# Patient Record
Sex: Male | Born: 1965
Health system: Southern US, Community
[De-identification: ages and names within clinical notes are randomized; demographics above are authoritative.]

## PROBLEM LIST (undated history)

## (undated) DIAGNOSIS — E739 Lactose intolerance, unspecified: Secondary | ICD-10-CM

## (undated) DIAGNOSIS — K589 Irritable bowel syndrome without diarrhea: Secondary | ICD-10-CM

## (undated) DIAGNOSIS — F32A Depression, unspecified: Secondary | ICD-10-CM

## (undated) DIAGNOSIS — M1990 Unspecified osteoarthritis, unspecified site: Secondary | ICD-10-CM

## (undated) DIAGNOSIS — F419 Anxiety disorder, unspecified: Secondary | ICD-10-CM

## (undated) DIAGNOSIS — K5090 Crohn's disease, unspecified, without complications: Secondary | ICD-10-CM

## (undated) DIAGNOSIS — K635 Polyp of colon: Secondary | ICD-10-CM

## (undated) DIAGNOSIS — F329 Major depressive disorder, single episode, unspecified: Secondary | ICD-10-CM

## (undated) HISTORY — DX: Depression, unspecified: F32.A

## (undated) HISTORY — DX: Unspecified osteoarthritis, unspecified site: M19.90

## (undated) HISTORY — DX: Crohn's disease, unspecified, without complications: K50.90

## (undated) HISTORY — PX: APPENDECTOMY: SHX54

## (undated) HISTORY — DX: Anxiety disorder, unspecified: F41.9

## (undated) HISTORY — DX: Major depressive disorder, single episode, unspecified: F32.9

## (undated) HISTORY — DX: Irritable bowel syndrome, unspecified: K58.9

## (undated) HISTORY — DX: Lactose intolerance, unspecified: E73.9

## (undated) HISTORY — DX: Polyp of colon: K63.5

---

## 1998-01-14 ENCOUNTER — Ambulatory Visit (HOSPITAL_COMMUNITY): Admission: RE | Admit: 1998-01-14 | Discharge: 1998-01-14 | Payer: Self-pay | Admitting: Family Medicine

## 1998-07-24 ENCOUNTER — Other Ambulatory Visit: Admission: RE | Admit: 1998-07-24 | Discharge: 1998-07-24 | Payer: Self-pay | Admitting: Gastroenterology

## 1998-08-07 ENCOUNTER — Encounter: Payer: Self-pay | Admitting: Gastroenterology

## 1998-08-07 ENCOUNTER — Ambulatory Visit (HOSPITAL_COMMUNITY): Admission: RE | Admit: 1998-08-07 | Discharge: 1998-08-07 | Payer: Self-pay | Admitting: Gastroenterology

## 2002-01-13 ENCOUNTER — Encounter: Payer: Self-pay | Admitting: Gastroenterology

## 2002-01-13 ENCOUNTER — Encounter: Admission: RE | Admit: 2002-01-13 | Discharge: 2002-01-13 | Payer: Self-pay | Admitting: Gastroenterology

## 2002-09-01 ENCOUNTER — Emergency Department (HOSPITAL_COMMUNITY): Admission: EM | Admit: 2002-09-01 | Discharge: 2002-09-01 | Payer: Self-pay | Admitting: Emergency Medicine

## 2002-09-01 ENCOUNTER — Encounter: Payer: Self-pay | Admitting: Emergency Medicine

## 2004-07-22 ENCOUNTER — Encounter: Admission: RE | Admit: 2004-07-22 | Discharge: 2004-07-22 | Payer: Self-pay | Admitting: Gastroenterology

## 2004-08-19 ENCOUNTER — Ambulatory Visit (HOSPITAL_COMMUNITY): Admission: RE | Admit: 2004-08-19 | Discharge: 2004-08-19 | Payer: Self-pay | Admitting: Gastroenterology

## 2011-11-25 ENCOUNTER — Ambulatory Visit (INDEPENDENT_AMBULATORY_CARE_PROVIDER_SITE_OTHER): Payer: PRIVATE HEALTH INSURANCE | Admitting: Internal Medicine

## 2011-11-25 ENCOUNTER — Encounter: Payer: Self-pay | Admitting: Internal Medicine

## 2011-11-25 VITALS — BP 100/62 | HR 73 | Temp 98.0°F | Resp 16 | Ht 69.5 in | Wt 140.0 lb

## 2011-11-25 DIAGNOSIS — K589 Irritable bowel syndrome without diarrhea: Secondary | ICD-10-CM | POA: Insufficient documentation

## 2011-11-25 DIAGNOSIS — F341 Dysthymic disorder: Secondary | ICD-10-CM

## 2011-11-25 DIAGNOSIS — G4700 Insomnia, unspecified: Secondary | ICD-10-CM | POA: Insufficient documentation

## 2011-11-25 DIAGNOSIS — F418 Other specified anxiety disorders: Secondary | ICD-10-CM

## 2011-11-25 MED ORDER — CILIDINIUM-CHLORDIAZEPOXIDE 2.5-5 MG PO CAPS: 1.0000 | ORAL_CAPSULE | Freq: Three times a day (TID) | ORAL | Status: DC | PRN

## 2011-11-25 MED ORDER — PAROXETINE HCL 20 MG PO TABS: 20.0000 mg | ORAL_TABLET | ORAL | Status: DC

## 2011-11-25 MED ORDER — TEMAZEPAM 30 MG PO CAPS: 30.0000 mg | ORAL_CAPSULE | Freq: Every evening | ORAL | Status: DC | PRN

## 2011-11-25 NOTE — Progress Notes (Signed)
  Subjective:    Patient ID: Ross Schwartz, male    DOB: July 09, 1965, 46 y.o.   MRN: [id number]  HPI Patient Active Problem List  Diagnosis  . IBS (irritable bowel syndrome)  . Depression with anxiety  . Insomnia   Last office visit 07/24/2010 He has done well this past year with Prozac, Xanax, and Librax. He takes one Xanax every few weeks to sleep when anxious He takes Prozac every other day He needs Librax for IBS every few weeks He is now concerned about the potential for losing his long-term job/this is creating anxiety He feels an obligation to his wife and children/70 year old daughter providing some stress He has some concerns that either his annex or Prozac or affecting his memory as he forgets things at work frequently/ remembers them later/he doesn't lose things/Has some out of body sensation occasionally Continues to smoke/no drug or alcohol problems Review of Systems  Constitutional: Negative for activity change, appetite change and unexpected weight change.  Eyes: Negative for visual disturbance.  Respiratory: Negative.   Cardiovascular: Negative.   Gastrointestinal: Negative.   Genitourinary: Negative.        Objective:   Physical Exam Blood pressure 100/62 Weight stable HEENT clear Neuro intact Mood good/affect stable/judgment sound       Assessment & Plan:   1. IBS (irritable bowel syndrome)   2. Depression with anxiety   3. Insomnia   Plan-change Prozac to Paxil which he's been on in the past DC Xanax/continue Librax/trial Restoril at bedtime if needed Followup in one to 3 months if not responding otherwise followup in 1 year

## 2014-11-18 ENCOUNTER — Ambulatory Visit (INDEPENDENT_AMBULATORY_CARE_PROVIDER_SITE_OTHER): Payer: Self-pay | Admitting: Internal Medicine

## 2014-11-18 VITALS — BP 118/70 | HR 68 | Temp 98.2°F | Resp 18 | Ht 68.75 in | Wt 131.8 lb

## 2014-11-18 DIAGNOSIS — K122 Cellulitis and abscess of mouth: Secondary | ICD-10-CM

## 2014-11-18 MED ORDER — CLINDAMYCIN HCL 150 MG PO CAPS: 150.0000 mg | ORAL_CAPSULE | Freq: Three times a day (TID) | ORAL | Status: DC

## 2014-11-18 NOTE — Progress Notes (Signed)
   Subjective:    Patient ID: Ross Schwartz, male    DOB: Sep 21, 1965, 49 y.o.   MRN: [id number] This chart was scribed for Ellamae Sia, MD by Littie Deeds, Medical Scribe. This patient was seen in Room 8 and the patient's care was started at 10:03 AM.   HPI HPI Comments: Ross Schwartz is a 49 y.o. male with a history of Crohn's disease who presents to the [facility] complaining of sudden onset sore throat that started 3 days ago after he swallowed some beer. Patient was drinking a beer outdoors when he felt as if he swallowed a bug after taking a sip of beer. He is unsure if there was a bug as he was unable to spit anything out. Since then, his throat has felt itchy and scratchy. This morning, he felt some swelling to the back of his tongue with some difficulty swallowing. Patient denies fever and cough. He has allergies to amoxicillin, penicillin and azithromycin.  He has noticed a dramatic change in his weight loss. Patient recently separated from his wife and has been going through increased stress. He has also been having diarrhea and does not have a good appetite. He denies palpitations and urinary symptoms. He also denies problems with acid reflux. See past history of IBS and concerns for potential Crohn's disease. See past history of anxiety disorder. Patient would like to have his appetite back; he states he despises eating food and would like that to change. His last colonoscopy was about 5 years ago. PSHx includes appendectomy.  Review of Systems Noncontributory    Objective:   Physical Exam  Constitutional: He is oriented to person, place, and time. He appears well-developed and well-nourished. No distress.  HENT:  Head: Normocephalic and atraumatic.  Mouth/Throat: Oropharynx is clear and moist. No oropharyngeal exudate.  The back of the throat is red and the uvula is swollen and red. The tongue appears normal. There are no regional lymph nodes.  Eyes: EOM  are normal. Pupils are equal, round, and reactive to light.  Neck: Neck supple. No thyromegaly present.  Cardiovascular: Normal rate.   Pulmonary/Chest: Effort normal and breath sounds normal.  Abdominal: Soft. He exhibits no mass. There is no tenderness.  Musculoskeletal: He exhibits no edema.  Lymphadenopathy:    He has no cervical adenopathy.  Neurological: He is alert and oriented to person, place, and time. No cranial nerve deficit.  Skin: Skin is warm and dry. No rash noted.  Psychiatric: He has a normal mood and affect. His behavior is normal.  Vitals reviewed. BP 118/70 mmHg  Pulse 68  Temp(Src) 98.2 F (36.8 C) (Oral)  Resp 18  Ht 5' 8.75" (1.746 m)  Wt 131 lb 12.8 oz (59.784 kg)  BMI 19.61 kg/m2  SpO2 98%      Assessment & Plan:  Pharyngitis/uvulitis--- probably viral/use and histamines and cold/if fails or gets worse we can use clindamycin based on his allergy pattern Recent weight loss with loss of appetite --- he declines further workup at this point because he is uninsured and so I've referred him to the health and wellness Center  I have completed the patient encounter in its entirety as documented by the scribe, with editing by me where necessary. Andres Bantz P. Merla Riches, M.D.

## 2014-11-18 NOTE — Patient Instructions (Addendum)
[facility] for lowest price Clinic run by [facility]=see printout

## 2015-09-18 ENCOUNTER — Ambulatory Visit (INDEPENDENT_AMBULATORY_CARE_PROVIDER_SITE_OTHER): Payer: Self-pay | Admitting: Physician Assistant

## 2015-09-18 ENCOUNTER — Ambulatory Visit (INDEPENDENT_AMBULATORY_CARE_PROVIDER_SITE_OTHER): Payer: Self-pay

## 2015-09-18 VITALS — BP 120/80 | HR 75 | Temp 97.7°F | Resp 16 | Ht 69.0 in | Wt 136.0 lb

## 2015-09-18 DIAGNOSIS — M545 Low back pain, unspecified: Secondary | ICD-10-CM

## 2015-09-18 DIAGNOSIS — W1809XA Striking against other object with subsequent fall, initial encounter: Secondary | ICD-10-CM

## 2015-09-18 MED ORDER — CYCLOBENZAPRINE HCL 5 MG PO TABS: 5.0000 mg | ORAL_TABLET | Freq: Three times a day (TID) | ORAL | Status: DC | PRN

## 2015-09-18 MED ORDER — DICLOFENAC SODIUM 75 MG PO TBEC: 75.0000 mg | DELAYED_RELEASE_TABLET | Freq: Two times a day (BID) | ORAL | Status: DC

## 2015-09-18 NOTE — Patient Instructions (Addendum)
  Heat and stretches use medications as directed   IF you received an x-ray today, you will receive an invoice from [facility]. Please contact [facility] at [phone] with questions or concerns regarding your invoice.   IF you received labwork today, you will receive an invoice from [employer]. Please contact [facility] at [phone] with questions or concerns regarding your invoice.   Our billing staff will not be able to assist you with questions regarding bills from these companies.  You will be contacted with the lab results as soon as they are available. The fastest way to get your results is to activate your My Chart account. Instructions are located on the last page of this paperwork. If you have not heard from [place] regarding the results in 2 weeks, please contact this office.

## 2015-09-18 NOTE — Progress Notes (Signed)
   Ross Schwartz  MRN: [id number] DOB: 04/26/1966  Subjective:  Pt presents to clinic with low back pain that started 3 days ago when he skateboard slipped and he landed on his back on the hard ground.  He felt like he would be doing ok but then yesterday he was more stiff and today it hurts to move in any direction through changing positions and rolling over is the worst.  He has no pain radiation down his left leg and no paresthesias.  He works [employer] representative.  Goody powder - no help No previous back injury  Patient Active Problem List   Diagnosis Date Noted  . IBS (irritable bowel syndrome) 11/25/2011  . Depression with anxiety 11/25/2011  . Insomnia 11/25/2011    No current outpatient prescriptions on file prior to visit.   No current facility-administered medications on file prior to visit.    Allergies  Allergen Reactions  . Amoxicillin   . Azithromycin   . Penicillins     Review of Systems  Musculoskeletal: Positive for back pain. Negative for gait problem.   Objective:  BP 120/80 mmHg  Pulse 75  Temp(Src) 97.7 F (36.5 C) (Oral)  Resp 16  Ht 5\' 9"  (1.753 m)  Wt 136 lb (61.689 kg)  BMI 20.07 kg/m2  SpO2 98%  Physical Exam  Constitutional: He is oriented to person, place, and time and well-developed, well-nourished, and in no distress.  HENT:  Head: Normocephalic and atraumatic.  Right Ear: External ear normal.  Left Ear: External ear normal.  Eyes: Conjunctivae are normal.  Neck: Normal range of motion.  Pulmonary/Chest: Effort normal.  Musculoskeletal:       Lumbar back: He exhibits decreased range of motion, tenderness (low back at sacral rim height), pain and spasm (Left paraspinal muscle).  Neurological: He is alert and oriented to person, place, and time. He has normal sensation, normal strength and normal reflexes. He displays no weakness and normal reflexes. He has a normal Straight Leg Raise Test. Gait normal. Gait normal.  Skin: Skin is warm and  dry.  Psychiatric: Mood, memory, affect and judgment normal.   Dg Lumbar Spine 2-3 Views  09/18/2015  CLINICAL DATA:  Back pain.  No known injury.  Initial evaluation. EXAM: LUMBAR SPINE - 2-3 VIEW COMPARISON:  07/22/2004. FINDINGS: Minimal diffuse degenerative change. No acute abnormality identified. Normal alignment mineralization. Mild aortic atherosclerotic vascular calcification. IMPRESSION: 1.  Minimal diffuse degenerative change.  No acute abnormality. 2.  Mild aortic atherosclerotic vascular calcification. Electronically Signed   By: Maisie Fus  Register   On: 09/18/2015 12:04    Assessment and Plan :  Left-sided low back pain without sciatica - Plan: DG Lumbar Spine 2-3 Views, cyclobenzaprine (FLEXERIL) 5 MG tablet, diclofenac (VOLTAREN) 75 MG EC tablet  Fall against object, initial encounter   Heat and stretches to his back.  This appears to be a musculoskeletal problem and we will treat the muscle spasms with relaxers and NSAIDs.  He will f/u if he has any further problems and we discussed warning signs and when to return to clinic.  Benny Lennert PA-C  [facility] 09/18/2015 12:36 PM

## 2015-10-06 ENCOUNTER — Other Ambulatory Visit: Payer: Self-pay | Admitting: Physician Assistant

## 2015-10-08 NOTE — Telephone Encounter (Signed)
Sarah, do you want to give RFs or RTC? 

## 2015-10-08 NOTE — Telephone Encounter (Signed)
Done

## 2015-11-27 ENCOUNTER — Other Ambulatory Visit: Payer: Self-pay | Admitting: Physician Assistant

## 2016-04-22 ENCOUNTER — Ambulatory Visit (INDEPENDENT_AMBULATORY_CARE_PROVIDER_SITE_OTHER): Payer: Self-pay | Admitting: Physician Assistant

## 2016-04-22 VITALS — BP 114/72 | HR 73 | Temp 97.5°F | Resp 17 | Ht 69.0 in | Wt 136.0 lb

## 2016-04-22 DIAGNOSIS — F332 Major depressive disorder, recurrent severe without psychotic features: Secondary | ICD-10-CM

## 2016-04-22 MED ORDER — CLONAZEPAM 1 MG PO TABS: 0.5000 mg | ORAL_TABLET | Freq: Two times a day (BID) | ORAL | 0 refills | Status: DC | PRN

## 2016-04-22 MED ORDER — TRAZODONE HCL 50 MG PO TABS: 25.0000 mg | ORAL_TABLET | Freq: Every evening | ORAL | 3 refills | Status: DC | PRN

## 2016-04-22 MED ORDER — FLUOXETINE HCL 20 MG PO CAPS: 20.0000 mg | ORAL_CAPSULE | Freq: Every day | ORAL | 3 refills | Status: DC

## 2016-04-22 NOTE — Progress Notes (Signed)
04/22/2016 2:34 PM   DOB: 04-30-1966 / MRN: [id number]  SUBJECTIVE:  Ross Schwartz is a 50 y.o. male presenting for depression. PHQ9 elevated per below. Complains of going to bed at 9 pm and waking up at 12 or one and can't go back to sleep. Recently broke up with his wife.  Sees his son some and says his son keeps him going, states he would be dead without him.  Does not like taking psychotropics.      Depression screen PHQ 2/9 04/22/2016  Decreased Interest 3  Down, Depressed, Hopeless 3  PHQ - 2 Score 6  Altered sleeping 3  Tired, decreased energy 3  Change in appetite 3  Feeling bad or failure about yourself  3  Trouble concentrating 3  Moving slowly or fidgety/restless 3  Suicidal thoughts 3  PHQ-9 Score 27       He is allergic to amoxicillin; azithromycin; and penicillins.   He  has a past medical history of Crohn's disease (HCC); Depression; and Irritable bowel syndrome (IBS).    He  reports that he has been smoking.  He has never used smokeless tobacco. He reports that he drinks alcohol. He reports that he uses drugs, including Marijuana. He  has no sexual activity history on file. The patient  has no past surgical history on file.  His family history includes Heart disease in his father and maternal grandmother.  Review of Systems  Constitutional: Negative for chills and fever.  Gastrointestinal: Negative for nausea.  Skin: Negative for itching and rash.  Neurological: Negative for dizziness.    The problem list and medications were reviewed and updated by myself where necessary and exist elsewhere in the encounter.   OBJECTIVE:  BP 114/72 (BP Location: Right Arm, Patient Position: Sitting, Cuff Size: Normal)   Pulse 73   Temp 97.5 F (36.4 C) (Oral)   Resp 17   Ht 5\' 9"  (1.753 m)   Wt 136 lb (61.7 kg)   SpO2 98%   BMI 20.08 kg/m   Physical Exam  Constitutional: He is oriented to person, place, and time. He appears well-developed and  well-nourished. No distress.  Cardiovascular: Normal rate, regular rhythm and normal heart sounds.   Pulmonary/Chest: Effort normal and breath sounds normal.  Musculoskeletal: Normal range of motion.  Neurological: He is alert and oriented to person, place, and time. He displays normal reflexes. No cranial nerve deficit. He exhibits normal muscle tone. Coordination normal.  Skin: Skin is warm and dry. He is not diaphoretic.  Psychiatric: His behavior is normal. Judgment and thought content normal. His mood appears not anxious. His affect is angry. His affect is not blunt, not labile and not inappropriate. His speech is not rapid and/or pressured, not delayed, not tangential and not slurred. Cognition and memory are normal. He exhibits a depressed mood. He expresses no suicidal plans and no homicidal plans. He is communicative.    No results found for this or any previous visit (from the past 72 hour(s)).  No results found.  ASSESSMENT AND PLAN  Ross Schwartz was seen today for depression.  Diagnoses and all orders for this visit:  Severe episode of recurrent major depressive disorder, without psychotic features (HCC): Significant burden.  Referring the therapy as well although he probably will not go.  He is very negative right now.  I have tried to encourage him and advise that he not miss any doses of medication. Advised my plan will help but it will take some  time. If no improvement will consider seroquel.  -     FLUoxetine (PROZAC) 20 MG capsule; Take 1 capsule (20 mg total) by mouth daily. -     traZODone (DESYREL) 50 MG tablet; Take 0.5-1 tablets (25-50 mg total) by mouth at bedtime as needed for sleep. -     clonazePAM (KLONOPIN) 1 MG tablet; Take 0.5 tablets (0.5 mg total) by mouth 2 (two) times daily as needed for anxiety. -      Amb psychology    The patient is advised to call or return to clinic if he does not see an improvement in symptoms, or to seek the care of the closest  emergency department if he worsens with the above plan.   Deliah Boston, MHS, PA-C [facility] 04/22/2016 2:34 PM

## 2016-04-22 NOTE — Patient Instructions (Signed)
     IF you received an x-ray today, you will receive an invoice from [facility]. Please contact [facility] at [phone] with questions or concerns regarding your invoice.   IF you received labwork today, you will receive an invoice from [facility]. Please contact [facility] at [phone] with questions or concerns regarding your invoice.   Our billing staff will not be able to assist you with questions regarding bills from these companies.  You will be contacted with the lab results as soon as they are available. The fastest way to get your results is to activate your My Chart account. Instructions are located on the last page of this paperwork. If you have not heard from us regarding the results in 2 weeks, please contact this office.      

## 2016-04-23 ENCOUNTER — Telehealth: Payer: Self-pay

## 2016-04-23 ENCOUNTER — Other Ambulatory Visit: Payer: Self-pay | Admitting: Physician Assistant

## 2016-04-23 MED ORDER — HYDROXYZINE HCL 10 MG PO TABS: 10.0000 mg | ORAL_TABLET | Freq: Every day | ORAL | 0 refills | Status: DC

## 2016-04-23 NOTE — Telephone Encounter (Signed)
He needs to go directly to the ED if this has not resolved as this is a urological emergency. He can not take trazodone anymore unfortunately as this is a known but rare side effect. Deliah Boston, MS, PA-C 2:25 PM, 04/23/2016

## 2016-04-23 NOTE — Telephone Encounter (Signed)
Patient is calling because the medication that was prescribed caused him an erection that lasted for a long period of time. Patient states that he asked the pharmacy about his reaction and they advised that it could either be Torazedone or Flouoxitine. Please advise!   [phone]

## 2016-04-23 NOTE — Telephone Encounter (Signed)
Please advise 

## 2016-04-23 NOTE — Telephone Encounter (Signed)
Spoke with patient while [facility] wrote this note. His erection resolved after 3 hours. He was advised to stop trazodone and cont. fluoxetine

## 2016-05-14 ENCOUNTER — Ambulatory Visit (INDEPENDENT_AMBULATORY_CARE_PROVIDER_SITE_OTHER): Payer: Self-pay | Admitting: Physician Assistant

## 2016-05-14 ENCOUNTER — Encounter: Payer: Self-pay | Admitting: Physician Assistant

## 2016-05-14 VITALS — BP 112/74 | HR 74 | Temp 98.0°F | Resp 16 | Ht 69.0 in | Wt 133.2 lb

## 2016-05-14 DIAGNOSIS — F332 Major depressive disorder, recurrent severe without psychotic features: Secondary | ICD-10-CM

## 2016-05-14 NOTE — Patient Instructions (Addendum)
  Try to eat 400 calories more than you are currently eating daily.    IF you received an x-ray today, you will receive an invoice from [facility]. Please contact [facility] at [phone] with questions or concerns regarding your invoice.   IF you received labwork today, you will receive an invoice from [facility]. Please contact [facility] at [phone] with questions or concerns regarding your invoice.   Our billing staff will not be able to assist you with questions regarding bills from these companies.  You will be contacted with the lab results as soon as they are available. The fastest way to get your results is to activate your My Chart account. Instructions are located on the last page of this paperwork. If you have not heard from [place] regarding the results in 2 weeks, please contact this office.

## 2016-05-14 NOTE — Progress Notes (Signed)
05/19/2016 8:23 AM   DOB: Nov 26, 1965 / MRN: [id number]  SUBJECTIVE:  Ross Schwartz is a 50 y.o. male presenting for recheck of depression.  Last I saw him he was feeling very dysthymic with suiHe is bcidality without a plan. He is back today for recheck. He did have one episode of erection lasting longer than normal, approximately 3 hours, however this did resolve. He was advised to stop the trazodone and was given hydroxyzine for sleep and anxiousness.    Today he reports not feeling much better.  He complains to me about not receiving his normal bonus from work.  States his job is safe.  Feels the prozac is reducing his appetite and did not take for three days and "was finally able to eat." Denies as crying as often and is going to bed and staying asleep for 6-7 hours nightly. No plan for SI/HI and does not endorse any suicidality to me despite being positive on his screening.   Depression screen [facility] 2/9 05/14/2016 04/22/2016 09/18/2015  Decreased Interest 3 3 0  Down, Depressed, Hopeless 3 3 0  PHQ - 2 Score 6 6 0  Altered sleeping 2 3 -  Tired, decreased energy 3 3 -  Change in appetite 3 3 -  Feeling bad or failure about yourself  3 3 -  Trouble concentrating 3 3 -  Moving slowly or fidgety/restless 3 3 -  Suicidal thoughts 3 3 -  PHQ-9 Score 26 27 -  Difficult doing work/chores Very difficult - -       He is allergic to amoxicillin; azithromycin; penicillins; and trazodone and nefazodone.   He  has a past medical history of Crohn's disease (HCC); Depression; and Irritable bowel syndrome (IBS).    He  reports that he has been smoking.  He has never used smokeless tobacco. He reports that he drinks alcohol. He reports that he uses drugs, including Marijuana. He  has no sexual activity history on file. The patient  has no past surgical history on file.  His family history includes Heart disease in his father and maternal grandmother.  Review of Systems  Constitutional:  Negative for chills and fever.  Skin: Negative for itching and rash.  Psychiatric/Behavioral: Positive for depression. Negative for hallucinations, memory loss, substance abuse and suicidal ideas. The patient is nervous/anxious. The patient does not have insomnia.     The problem list and medications were reviewed and updated by myself where necessary and exist elsewhere in the encounter.   OBJECTIVE:  BP 112/74   Pulse 74   Temp 98 F (36.7 C) (Oral)   Resp 16   Ht 5\' 9"  (1.753 m)   Wt 133 lb 3.2 oz (60.4 kg)   SpO2 99%   BMI 19.67 kg/m   Physical Exam  Cardiovascular: Normal rate and regular rhythm.   Pulmonary/Chest: Effort normal and breath sounds normal.  Psychiatric: He has a normal mood and affect. His speech is normal and behavior is normal. Judgment and thought content normal. Cognition and memory are normal.    Wt Readings from Last 3 Encounters:  05/14/16 133 lb 3.2 oz (60.4 kg)  04/22/16 136 lb (61.7 kg)  09/18/15 136 lb (61.7 kg)     No results found for this or any previous visit (from the past 72 hour(s)).  No results found.  ASSESSMENT AND PLAN  Ross Schwartz was seen today for follow-up.  Diagnoses and all orders for this visit:  Severe episode of recurrent major  depressive disorder, without psychotic features (HCC): Somewhat improved.  He is not crying today, is sleeping better.  I have advised he continue prozac and supplement his diet.  Will give him a call in about three weeks to see how he is doing.     The patient is advised to call or return to clinic if he does not see an improvement in symptoms, or to seek the care of the closest emergency department if he worsens with the above plan.   Deliah BostonMichael Khyren Hing, MHS, PA-C [facility] 05/19/2016 8:23 AM

## 2016-05-26 ENCOUNTER — Other Ambulatory Visit: Payer: Self-pay | Admitting: Physician Assistant

## 2016-05-28 ENCOUNTER — Telehealth: Payer: Self-pay | Admitting: Physician Assistant

## 2016-05-28 NOTE — Telephone Encounter (Signed)
-----   Message from Ofilia Neas, PA-C sent at 05/14/2016  4:01 PM EST ----- Call on 1/4 to discuss mood and possible medication changes.

## 2016-05-28 NOTE — Telephone Encounter (Signed)
Depression screen [facility] 2/9 05/14/2016 04/22/2016 09/18/2015  Decreased Interest 3 3 0  Down, Depressed, Hopeless 3 3 0  PHQ - 2 Score 6 6 0  Altered sleeping 2 3 -  Tired, decreased energy 3 3 -  Change in appetite 3 3 -  Feeling bad or failure about yourself  3 3 -  Trouble concentrating 3 3 -  Moving slowly or fidgety/restless 3 3 -  Suicidal thoughts 3 3 -  PHQ-9 Score 26 27 -  Difficult doing work/chores Very difficult - -   I spoke with Ross Schwartz on the phone today to see how he was feeling. Denies real significant anhedonia.  Denies SI and HI today.  Denies any change in ability to concentration. Overall states he is feeling much better and does not want to increase his dosage of prozac.  Has only needed one Klonopin in the last 7 days.  He will make an appointment for 1 month and advised that if he is doing well at that time that he does not need to come in and to call and cancer appointment.   Deliah Boston, MS, PA-C 1:57 PM, 05/28/2016

## 2016-06-29 ENCOUNTER — Ambulatory Visit: Payer: Self-pay | Admitting: Physician Assistant

## 2016-08-26 ENCOUNTER — Other Ambulatory Visit: Payer: Self-pay | Admitting: Physician Assistant

## 2016-08-26 DIAGNOSIS — F332 Major depressive disorder, recurrent severe without psychotic features: Secondary | ICD-10-CM

## 2016-11-02 ENCOUNTER — Other Ambulatory Visit: Payer: Self-pay | Admitting: Physician Assistant

## 2016-11-02 DIAGNOSIS — F332 Major depressive disorder, recurrent severe without psychotic features: Secondary | ICD-10-CM

## 2017-07-07 ENCOUNTER — Emergency Department (HOSPITAL_COMMUNITY)
Admission: EM | Admit: 2017-07-07 | Discharge: 2017-07-07 | Disposition: A | Discharge status: Home / Self Care | Payer: Self-pay | Attending: Emergency Medicine | Admitting: Emergency Medicine

## 2017-07-07 ENCOUNTER — Other Ambulatory Visit: Payer: Self-pay

## 2017-07-07 ENCOUNTER — Emergency Department (HOSPITAL_COMMUNITY): Payer: Self-pay

## 2017-07-07 ENCOUNTER — Encounter (HOSPITAL_COMMUNITY): Payer: Self-pay | Admitting: Emergency Medicine

## 2017-07-07 DIAGNOSIS — R1030 Lower abdominal pain, unspecified: Secondary | ICD-10-CM | POA: Insufficient documentation

## 2017-07-07 DIAGNOSIS — K50911 Crohn's disease, unspecified, with rectal bleeding: Secondary | ICD-10-CM | POA: Insufficient documentation

## 2017-07-07 DIAGNOSIS — R197 Diarrhea, unspecified: Secondary | ICD-10-CM | POA: Insufficient documentation

## 2017-07-07 DIAGNOSIS — F1210 Cannabis abuse, uncomplicated: Secondary | ICD-10-CM | POA: Insufficient documentation

## 2017-07-07 DIAGNOSIS — K625 Hemorrhage of anus and rectum: Secondary | ICD-10-CM | POA: Insufficient documentation

## 2017-07-07 DIAGNOSIS — F17210 Nicotine dependence, cigarettes, uncomplicated: Secondary | ICD-10-CM | POA: Insufficient documentation

## 2017-07-07 DIAGNOSIS — R110 Nausea: Secondary | ICD-10-CM | POA: Insufficient documentation

## 2017-07-07 LAB — CBC
HCT: 44.1 % (ref 39.0–52.0)
HEMOGLOBIN: 15.6 g/dL (ref 13.0–17.0)
MCH: 32.2 pg (ref 26.0–34.0)
MCHC: 35.4 g/dL (ref 30.0–36.0)
MCV: 90.9 fL (ref 78.0–100.0)
PLATELETS: 142 10*3/uL — AB (ref 150–400)
RBC: 4.85 MIL/uL (ref 4.22–5.81)
RDW: 12.6 % (ref 11.5–15.5)
WBC: 6.6 10*3/uL (ref 4.0–10.5)

## 2017-07-07 LAB — COMPREHENSIVE METABOLIC PANEL
ALBUMIN: 4.6 g/dL (ref 3.5–5.0)
ALT: 21 U/L (ref 17–63)
AST: 29 U/L (ref 15–41)
Alkaline Phosphatase: 67 U/L (ref 38–126)
Anion gap: 9 (ref 5–15)
BUN: 7 mg/dL (ref 6–20)
CHLORIDE: 100 mmol/L — AB (ref 101–111)
CO2: 28 mmol/L (ref 22–32)
CREATININE: 0.91 mg/dL (ref 0.61–1.24)
Calcium: 9.3 mg/dL (ref 8.9–10.3)
GFR calc non Af Amer: >60 mL/min (ref >60)
Glucose, Bld: 95 mg/dL (ref 65–99)
Potassium: 3.6 mmol/L (ref 3.5–5.1)
Sodium: 137 mmol/L (ref 135–145)
Total Bilirubin: 0.8 mg/dL (ref 0.3–1.2)
Total Protein: 8.3 g/dL — ABNORMAL HIGH (ref 6.5–8.1)

## 2017-07-07 LAB — URINALYSIS, ROUTINE W REFLEX MICROSCOPIC
Bilirubin Urine: NEGATIVE
Glucose, UA: NEGATIVE mg/dL
HGB URINE DIPSTICK: NEGATIVE
KETONES UR: NEGATIVE mg/dL
Leukocytes, UA: NEGATIVE
Nitrite: NEGATIVE
PROTEIN: NEGATIVE mg/dL
SPECIFIC GRAVITY, URINE: 1.005 (ref 1.005–1.030)
pH: 6 (ref 5.0–8.0)

## 2017-07-07 LAB — TYPE AND SCREEN
ABO/RH(D): B POS
ANTIBODY SCREEN: NEGATIVE

## 2017-07-07 LAB — POC OCCULT BLOOD, ED: Fecal Occult Bld: POSITIVE — AB

## 2017-07-07 LAB — LIPASE, BLOOD: LIPASE: 37 U/L (ref 11–51)

## 2017-07-07 MED ORDER — ONDANSETRON 4 MG PO TBDP: 4.0000 mg | ORAL_TABLET | Freq: Three times a day (TID) | ORAL | 0 refills | Status: DC | PRN

## 2017-07-07 MED ORDER — IOPAMIDOL (ISOVUE-300) INJECTION 61%
INTRAVENOUS | Status: AC
  Administered 2017-07-07: 30 mL via ORAL
  Filled 2017-07-07: qty 100

## 2017-07-07 MED ORDER — DICYCLOMINE HCL 20 MG PO TABS: 20.0000 mg | ORAL_TABLET | Freq: Two times a day (BID) | ORAL | 0 refills | Status: DC

## 2017-07-07 MED ORDER — FAMOTIDINE 40 MG PO TABS: 40.0000 mg | ORAL_TABLET | Freq: Every day | ORAL | 0 refills | Status: DC

## 2017-07-07 MED ORDER — IOPAMIDOL (ISOVUE-300) INJECTION 61%
INTRAVENOUS | Status: AC
  Administered 2017-07-07: 100 mL via INTRAVENOUS
  Filled 2017-07-07: qty 30

## 2017-07-07 NOTE — Discharge Instructions (Signed)
Take Zofran for nausea as needed Take Bentyl for cramping Take Pepcid for upset stomach Coupons have been provided. Pepcid is over the counter so if it's too expensive, ask the pharmacist for an alternative. Please make an appointment with [facility]

## 2017-07-07 NOTE — ED Provider Notes (Signed)
[facility]-EMERGENCY DEPT Provider Note   CSN: [id number] Arrival date & time: 07/07/17  1342     History   Chief Complaint Chief Complaint  Patient presents with  . Abdominal Pain  . Rectal Bleeding    HPI Ross Schwartz is a 52 y.o. male who presents with abdominal pain and rectal bleeding. PMH significant for Crohn's disease, IBS. He states that he's had chronic abdominal pain for years. His Crohn's has been untreated for 10-15 years due to lack of insurance. Over the past couple months he's had gradually worsening lower abdominal pain. It feels like severe cramping. He reports associated diarrhea and sometimes has rectal bleeding which has also been more frequent. He denies rectal pain. He previously saw a GI doctor at [facility]. He reports chronic nausea as well. He came today because he was "fed up" of feeling bad. Past surgical hx significant for appendectomy.  HPI  Past Medical History:  Diagnosis Date  . Crohn's disease (HCC)   . Depression   . Irritable bowel syndrome (IBS)     Patient Active Problem List   Diagnosis Date Noted  . IBS (irritable bowel syndrome) 11/25/2011  . Depression with anxiety 11/25/2011  . Insomnia 11/25/2011    Past Surgical History:  Procedure Laterality Date  . APPENDECTOMY         Home Medications    Prior to Admission medications   Medication Sig Start Date End Date Taking? Authorizing Provider  Aspirin-Acetaminophen-Caffeine (GOODY HEADACHE PO) Take 1 packet by mouth daily as needed (PAIN).   Yes [provider]  Neomycin-Polymyxin-Gramicidin (NEOSPORIN OP) Apply 1 application to eye daily as needed (WOUND CARE).   Yes [provider]  clonazePAM (KLONOPIN) 1 MG tablet Take 0.5 tablets (0.5 mg total) by mouth 2 (two) times daily as needed for anxiety. Patient not taking: Reported on 07/07/2017 04/22/16   Ofilia Neas, PA-C  cyclobenzaprine (FLEXERIL) 5 MG tablet TAKE 1 TABLET BY MOUTH  3 TIMES A DAY AS NEEDED FOR MUSCLE SPASMS Patient not taking: Reported on 07/07/2017 11/29/15   Valarie Cones, Dema Severin, PA-C  diclofenac (VOLTAREN) 75 MG EC tablet TAKE 1 TABLET BY MOUTH TWICE A DAY Patient not taking: Reported on 07/07/2017 11/29/15   Valarie Cones, Dema Severin, PA-C  FLUoxetine (PROZAC) 20 MG capsule TAKE 1 CAPSULE (20 MG TOTAL) BY MOUTH DAILY. Patient not taking: Reported on 07/07/2017 08/26/16   Ofilia Neas, PA-C  hydrOXYzine (ATARAX/VISTARIL) 10 MG tablet TAKE 1 TO 2 TABLETS BY MOUTH AT BEDTIME Patient not taking: Reported on 07/07/2017 05/27/16   Ofilia Neas, PA-C    Family History Family History  Problem Relation Age of Onset  . Heart disease Father   . Heart disease Maternal Grandmother     Social History Social History   Tobacco Use  . Smoking status: Current Every Day Smoker  . Smokeless tobacco: Never Used  Substance Use Topics  . Alcohol use: Yes    Alcohol/week: 0.0 oz  . Drug use: Yes    Types: Marijuana     Allergies   Amoxicillin; Azithromycin; Penicillins; and Trazodone and nefazodone   Review of Systems Review of Systems  Constitutional: Negative for chills and fever.  Respiratory: Negative for shortness of breath.   Cardiovascular: Negative for chest pain.  Gastrointestinal: Positive for abdominal pain, anal bleeding, blood in stool, diarrhea and nausea. Negative for constipation, rectal pain and vomiting.  Genitourinary: Negative for dysuria.  Neurological: Negative for light-headedness.  All other systems  reviewed and are negative.    Physical Exam Updated Vital Signs BP (!) 143/74 (BP Location: Right Arm)   Pulse 63   Temp 97.9 F (36.6 C) (Oral)   Resp 18   SpO2 100%   Physical Exam  Constitutional: He is oriented to person, place, and time. He appears well-developed and well-nourished. No distress.  Calm, cooperative  HENT:  Head: Normocephalic and atraumatic.  Eyes: Conjunctivae are normal. Pupils are equal, round, and reactive to  light. Right eye exhibits no discharge. Left eye exhibits no discharge. No scleral icterus.  Neck: Normal range of motion.  Cardiovascular: Normal rate and regular rhythm.  Pulmonary/Chest: Effort normal and breath sounds normal. No respiratory distress.  Abdominal: Soft. Bowel sounds are normal. He exhibits distension. He exhibits no mass. There is tenderness (diffuse lower abdominal tenderness). There is no rebound and no guarding. No hernia.  Genitourinary:  Genitourinary Comments: Rectal: No gross blood, hemorrhoids, fissures, redness, area of fluctuance, lesions, or tenderness. Chaperone present during exam.   Neurological: He is alert and oriented to person, place, and time.  Skin: Skin is warm and dry.  Psychiatric: He has a normal mood and affect. His behavior is normal.  Nursing note and vitals reviewed.    ED Treatments / Results  Labs (all labs ordered are listed, but only abnormal results are displayed) Labs Reviewed  COMPREHENSIVE METABOLIC PANEL - Abnormal; Notable for the following components:      Result Value   Chloride 100 (*)    Total Protein 8.3 (*)    All other components within normal limits  CBC - Abnormal; Notable for the following components:   Platelets 142 (*)    All other components within normal limits  URINALYSIS, ROUTINE W REFLEX MICROSCOPIC - Abnormal; Notable for the following components:   Color, Urine STRAW (*)    All other components within normal limits  POC OCCULT BLOOD, ED - Abnormal; Notable for the following components:   Fecal Occult Bld POSITIVE (*)    All other components within normal limits  LIPASE, BLOOD  POC OCCULT BLOOD, ED  TYPE AND SCREEN  ABO/RH    EKG  EKG Interpretation None       Radiology Ct Abdomen Pelvis W Contrast  Result Date: 07/07/2017 CLINICAL DATA:  52 year old male with history of irritable bowel syndrome in Crohn's disease presenting with abdominal pain, cramping and rectal bleeding for the past several  months. EXAM: CT ABDOMEN AND PELVIS WITH CONTRAST TECHNIQUE: Multidetector CT imaging of the abdomen and pelvis was performed using the standard protocol following bolus administration of intravenous contrast. CONTRAST:  ISOVUE-300 IOPAMIDOL (ISOVUE-300) INJECTION 61%, 79mL ISOVUE-300 IOPAMIDOL (ISOVUE-300) INJECTION 61% COMPARISON:  No priors. FINDINGS: Lower chest: Mild scarring in the right middle lobe. Hepatobiliary: No suspicious cystic or solid hepatic lesions. No intra or extrahepatic biliary ductal dilatation. Gallbladder is normal in appearance. Pancreas: No pancreatic mass. No pancreatic ductal dilatation. No pancreatic or peripancreatic fluid or inflammatory changes. Spleen: Unremarkable. Adrenals/Urinary Tract: Bilateral kidneys and bilateral adrenal glands are normal in appearance. No hydroureteronephrosis. Urinary bladder is normal in appearance. Stomach/Bowel: Normal appearance of the stomach. No pathologic dilatation of small bowel or colon. Numerous colonic diverticulae are noted, without surrounding inflammatory changes to suggest an acute diverticulitis at this time. No focal areas of mural thickening or surrounding inflammatory changes are noted in the small bowel or colon. Terminal ileum is grossly unremarkable in appearance. The appendix is not confidently identified and may be surgically absent. Regardless,  there are no inflammatory changes noted adjacent to the cecum to suggest the presence of an acute appendicitis at this time. Vascular/Lymphatic: Aortic atherosclerosis, without evidence of aneurysm or dissection in the abdominal or pelvic vasculature. No lymphadenopathy noted in the abdomen or pelvis. Reproductive: Prostate gland and seminal vesicles are unremarkable in appearance. Other: No significant volume of ascites.  No pneumoperitoneum. Musculoskeletal: There are no aggressive appearing lytic or blastic lesions noted in the visualized portions of the skeleton. IMPRESSION: 1. No  acute findings are noted in the abdomen or pelvis to account for the patient's symptoms. Specifically, no definite findings to suggest active Crohn's disease. 2. Colonic diverticulosis without evidence of acute diverticulitis at this time. 3. Aortic atherosclerosis. Aortic Atherosclerosis (ICD10-I70.0). Electronically Signed   By: Trudie Reed M.D.   On: 07/07/2017 19:53    Procedures Procedures (including critical care time)  Medications Ordered in ED Medications  iopamidol (ISOVUE-300) 61 % injection (30 mLs Oral Contrast Given 07/07/17 1730)  iopamidol (ISOVUE-300) 61 % injection (100 mLs Intravenous Contrast Given 07/07/17 1923)     Initial Impression / Assessment and Plan / ED Course  I have reviewed the triage vital signs and the nursing notes.  Pertinent labs & imaging results that were available during my care of the patient were reviewed by me and considered in my medical decision making (see chart for details).  52 year old male presents with acute on chronic abdominal pain and worsening rectal bleeding. Unclear etiology. Suspect an internal hemorrhoid vs very mild Crohn's disease. Vitals are normal. Abdomen is minimally tender across the lower abdomen. Rectal exam is negative for gross blood. Blood work is overall unremarkable. UA is normal. Hemoccult is positive. CT abdomen/pelvis was ordered which is negative. He was given rx for Bentyl, Pepcid, Zofran and advised to establish care with PCP and see GI. Return precautions given.  Final Clinical Impressions(s) / ED Diagnoses   Final diagnoses:  Rectal bleeding  Lower abdominal pain    ED Discharge Orders    None       Bethel Born, PA-C 07/07/17 2331    Bethann Berkshire, MD 07/08/17 1705

## 2017-07-07 NOTE — ED Triage Notes (Signed)
Patient presents ambulatory stating he has IBS and Crohns. Having abdominal pain, cramping and rectal bleeding for months. Here today due to worsening pain.

## 2017-07-08 LAB — ABO/RH: ABO/RH(D): B POS

## 2017-07-28 ENCOUNTER — Ambulatory Visit: Payer: Self-pay | Attending: Family Medicine | Admitting: Licensed Clinical Social Worker

## 2017-07-28 ENCOUNTER — Ambulatory Visit: Payer: Self-pay | Attending: Internal Medicine | Admitting: Physician Assistant

## 2017-07-28 VITALS — BP 115/78 | HR 65 | Temp 97.8°F | Resp 16 | Ht 68.0 in | Wt 129.8 lb

## 2017-07-28 DIAGNOSIS — F321 Major depressive disorder, single episode, moderate: Secondary | ICD-10-CM

## 2017-07-28 DIAGNOSIS — Z86010 Personal history of colonic polyps: Secondary | ICD-10-CM | POA: Insufficient documentation

## 2017-07-28 DIAGNOSIS — K589 Irritable bowel syndrome without diarrhea: Secondary | ICD-10-CM | POA: Insufficient documentation

## 2017-07-28 DIAGNOSIS — F17210 Nicotine dependence, cigarettes, uncomplicated: Secondary | ICD-10-CM | POA: Insufficient documentation

## 2017-07-28 DIAGNOSIS — M545 Low back pain: Secondary | ICD-10-CM | POA: Insufficient documentation

## 2017-07-28 DIAGNOSIS — Z79899 Other long term (current) drug therapy: Secondary | ICD-10-CM | POA: Insufficient documentation

## 2017-07-28 DIAGNOSIS — Z09 Encounter for follow-up examination after completed treatment for conditions other than malignant neoplasm: Secondary | ICD-10-CM | POA: Insufficient documentation

## 2017-07-28 DIAGNOSIS — Z7982 Long term (current) use of aspirin: Secondary | ICD-10-CM | POA: Insufficient documentation

## 2017-07-28 DIAGNOSIS — K219 Gastro-esophageal reflux disease without esophagitis: Secondary | ICD-10-CM | POA: Insufficient documentation

## 2017-07-28 DIAGNOSIS — K625 Hemorrhage of anus and rectum: Secondary | ICD-10-CM | POA: Insufficient documentation

## 2017-07-28 DIAGNOSIS — G8929 Other chronic pain: Secondary | ICD-10-CM | POA: Insufficient documentation

## 2017-07-28 DIAGNOSIS — F329 Major depressive disorder, single episode, unspecified: Secondary | ICD-10-CM | POA: Insufficient documentation

## 2017-07-28 DIAGNOSIS — K50911 Crohn's disease, unspecified, with rectal bleeding: Secondary | ICD-10-CM | POA: Insufficient documentation

## 2017-07-28 MED ORDER — DICYCLOMINE HCL 20 MG PO TABS: 20.0000 mg | ORAL_TABLET | Freq: Four times a day (QID) | ORAL | 1 refills | Status: DC

## 2017-07-28 MED ORDER — FAMOTIDINE 40 MG PO TABS: 40.0000 mg | ORAL_TABLET | Freq: Every day | ORAL | 3 refills | Status: DC

## 2017-07-28 MED ORDER — TRAMADOL HCL 50 MG PO TABS: 50.0000 mg | ORAL_TABLET | Freq: Three times a day (TID) | ORAL | 0 refills | Status: DC | PRN

## 2017-07-28 MED ORDER — CYCLOBENZAPRINE HCL 10 MG PO TABS: 10.0000 mg | ORAL_TABLET | Freq: Every day | ORAL | 0 refills | Status: DC

## 2017-07-28 MED FILL — CYCLOBENZAPRINE 10 MG TAB: 10 | 14 days supply | Qty: 14 | Fill #0

## 2017-07-28 MED FILL — DICYCLOMINE 20 MG TABLET: 20 | 30 days supply | Qty: 120 | Fill #0

## 2017-07-28 MED FILL — ?FAMOTIDINE 40 MG TABLETS: 40 MG | 30 days supply | Qty: 30 | Fill #0

## 2017-07-28 NOTE — BH Specialist Note (Signed)
Integrated Behavioral Health Initial Visit  MRN: [id number] Name: Ross Schwartz  Number of Integrated Behavioral Health Clinician visits:: 1/6 Session Start time: 11:30 AM  Session End time: 12:00 PM Total time: 30 minutes  Type of Service: Integrated Behavioral Health- Individual/Family Interpretor:No. Interpretor Name and Language: N/A   Warm Hand Off Completed.       SUBJECTIVE: Ross Schwartz is a 52 y.o. male accompanied by self Patient was referred by Zenda Alpers for depression and anxiety. Patient reports the following symptoms/concerns: overwhelming feelings of sadness and worry, difficulty sleeping, low motivation/energy, poor appetite, decreased concentration Duration of problem: 2 months; Severity of problem: severe  OBJECTIVE: Mood: Depressed and Affect: Tearful Risk of harm to self or others: No plan to harm self or others  LIFE CONTEXT: Family and Social: Pt receives strong support from his brother and family. He has a minor child that visits bi-weekly School/Work: Pt is employed. He was denied disability in 2006 Self-Care: No report of substance use Life Changes: Pt reports separation from spouse and ongoing medical conditions  GOALS ADDRESSED: Patient will: 1. Reduce symptoms of: anxiety and depression 2. Increase knowledge and/or ability of: coping skills  3. Demonstrate ability to: Increase healthy adjustment to current life circumstances and Increase adequate support systems for patient/family  INTERVENTIONS: Interventions utilized: Motivational Interviewing, Supportive Counseling, Psychoeducation and/or Health Education and Link to [facility]  Standardized Assessments completed: GAD-7 and PHQ 2&9 with C-SSRS  ASSESSMENT: Patient currently experiencing depression and anxiety triggered by separation from spouse and ongoing medical concerns. He reports overwhelming feelings of sadness and worry, difficulty sleeping, low motivation/energy, poor  appetite, decreased concentration. Pt denies current or past SI/HI/AVH.    Patient may benefit from psychoeducation, psychotherapy, and medication management. LCSWA educated pt on correlation between one's physical and mental health. Pt has previous hx with medication management and is not interested in restarting medications or psychotherapy at this time. Therapeutic interventions were discussed. Pt enjoys making art to cope with stressors. LCSWA provided pt with crisis resources, assisted pt in obtaining medication, and strongly encouraged pt to schedule appointment with financial counseling to address hospital bills.   PLAN: 1. Follow up with behavioral health clinician on : Pt was encouraged to contact LCSWA if symptoms worsen or fail to improve to schedule behavioral appointments at [facility]. 2. Behavioral recommendations: LCSWA recommends that pt apply healthy coping skills discussed, schedule appointment with financial counseling, and utilize provided resources. Pt is encouraged to schedule follow up appointment with LCSWA 3. Referral(s): Integrated Art gallery manager (In Clinic) and [employer]:  Finances 4. "From scale of 1-10, how likely are you to follow plan?": 8/10  Bridgett Larsson, LCSW 07/29/17 2:14 PM

## 2017-07-28 NOTE — Progress Notes (Signed)
Ross Schwartz  ZOX:[id number]  WJX:[id number]  DOB - [birth year]  Chief Complaint  Patient presents with  . Hospitalization Follow-up       Subjective:   Ross Schwartz is a 52 y.o. male here today for establishment of care. He has a reported past medical history of Crohn's disease, irritable bowel syndrome and is a smoker. From a GI standpoint several years ago he underwent colonoscopy and had a polyp removed that was noncancerous. He presented to the emergency department on 07/07/2017 with complaints of lower abdominal crampy pain, rectal bleeding, diarrhea and nausea. His vital signs were stable. His hemoglobin was stable. There was a positive fecal blood test. His platelets were 142. CT of the abdomen and pelvis showed no acute process and no evidence of Crohn's disease. He did have some diverticulosis without diverticulitis. He was given prescriptions for Bentyl, Pepcid and Zofran and told to follow-up with GI. He was seen by GI 07/16/2017 in all of his records were reviewed. There recommendation is for dicyclomine and Pepcid. Colonoscopy once his insurance kicks in place.  He also has issues with depression. He has tried multiple medications in the past that have made him feel bad. He wishes to not have medications at this time but is willing to speak with our Child psychotherapist. He has difficulty with his finances. He has no insurance. He has difficulty with relationships. Not suicidal. Not homicidal.  He also complains of chronic lower back pain and requesting medications.  He also is a smoker.  ROS: GEN: denies fever or chills, denies change in weight Skin: denies lesions or rashes HEENT: denies headache, earache, epistaxis, sore throat, or neck pain LUNGS: denies SHOB, dyspnea, PND, orthopnea CV: denies CP or palpitations ABD: denies abd pain, N or V EXT: denies muscle spasms or swelling; no pain in lower ext, no weakness NEURO: denies numbness or tingling, denies sz, stroke or  TIA  ALLERGIES: Allergies  Allergen Reactions  . Amoxicillin     Has patient had a PCN reaction causing immediate rash, facial/tongue/throat swelling, SOB or lightheadedness with hypotension: Yes Has patient had a PCN reaction causing severe rash involving mucus membranes or skin necrosis: No Has patient had a PCN reaction that required hospitalization: No Has patient had a PCN reaction occurring within the last 10 years: No If all of the above answers are "NO", then may proceed with Cephalosporin use.   . Azithromycin   . Penicillins     Has patient had a PCN reaction causing immediate rash, facial/tongue/throat swelling, SOB or lightheadedness with hypotension: Yes Has patient had a PCN reaction causing severe rash involving mucus membranes or skin necrosis: No Has patient had a PCN reaction that required hospitalization:No Has patient had a PCN reaction occurring within the last 10 years: No If all of the above answers are "NO", then may proceed with Cephalosporin use.   . Trazodone And Nefazodone Other (See Comments)    Prolonged erection    PAST MEDICAL HISTORY: Past Medical History:  Diagnosis Date  . Crohn's disease (HCC)   . Depression   . Irritable bowel syndrome (IBS)     PAST SURGICAL HISTORY: Past Surgical History:  Procedure Laterality Date  . APPENDECTOMY      MEDICATIONS AT HOME: Prior to Admission medications   Medication Sig Start Date End Date Taking? Authorizing Provider  Aspirin-Acetaminophen-Caffeine (GOODY HEADACHE PO) Take 1 packet by mouth daily as needed (PAIN).    [provider]  cyclobenzaprine (FLEXERIL) 10 MG tablet  Take 1 tablet (10 mg total) by mouth at bedtime. 07/28/17   Vivianne Master, PA-C  dicyclomine (BENTYL) 20 MG tablet Take 1 tablet (20 mg total) by mouth 4 (four) times daily. 07/28/17   Vivianne Master, PA-C  famotidine (PEPCID) 40 MG tablet Take 1 tablet (40 mg total) by mouth daily. 07/28/17   Vivianne Master, PA-C  traMADol  (ULTRAM) 50 MG tablet Take 1 tablet (50 mg total) by mouth every 8 (eight) hours as needed. 07/28/17   Vivianne Master, PA-C    Family History  Problem Relation Age of Onset  . Heart disease Father   . Heart disease Maternal Psychiatrist, 1 son, works for brother, smoker  Objective:   Vitals:   07/28/17 1022  BP: 115/78  Pulse: 65  Resp: 16  Temp: 97.8 F (36.6 C)  TempSrc: Oral  SpO2: 100%  Weight: 129 lb 12.8 oz (58.9 kg)  Height: 5\' 8"  (1.727 m)    Exam General appearance : Awake, alert, not in any distress. Speech Clear. Not toxic looking Chest:Good air entry bilaterally, no added sounds  CVS: S1 S2 regular, no murmurs.  Abdomen: Bowel sounds present, diffusely tender and not distended with no guarding, rigidity or rebound. Extremities: B/L Lower Ext shows no edema, both legs are warm to touch; dec ROM Neurology: Awake alert, and oriented X 3, CN II-XII intact, Non focal Skin:No Rash Wounds:N/A   Assessment & Plan  1. Rectal Bleeding  -hx colon polyp removal  -seen by GI 07/16/17, colon planned for future 2. GERD  -PPI  -avoid triggers 3. IBS  -Cont Bentyl  -avoid triggers 4. Depression  -screened by SW/resources offered  -does not want to be on meds 5. Chronic LBP  -Flexeril/Tramadol 6. Smoker  -not ready to quit   Return in about 4 weeks (around 08/25/2017).  The patient was given clear instructions to go to ER or return to medical center if symptoms don't improve, worsen or new problems develop. The patient verbalized understanding. The patient was told to call to get lab results if they haven't heard anything in the next week.   Total time spent with patient was 31 min. Greater than 50 % of this visit was spent face to face counseling and coordinating care regarding risk factor modification, compliance importance and encouragement, education related to multi complaints.  This note has been created with Furniture conservator/restorer. Any transcriptional errors are unintentional.    Scot Jun, PA-C [facility] [place], [place] [phone]   07/28/2017, 10:53 AM

## 2017-08-24 ENCOUNTER — Encounter: Payer: Self-pay | Admitting: Nurse Practitioner

## 2017-08-24 ENCOUNTER — Ambulatory Visit: Payer: Self-pay | Attending: Nurse Practitioner | Admitting: Nurse Practitioner

## 2017-08-24 VITALS — BP 111/70 | HR 72 | Temp 98.1°F | Ht 68.0 in | Wt 132.6 lb

## 2017-08-24 DIAGNOSIS — Z7982 Long term (current) use of aspirin: Secondary | ICD-10-CM | POA: Insufficient documentation

## 2017-08-24 DIAGNOSIS — Z880 Allergy status to penicillin: Secondary | ICD-10-CM | POA: Insufficient documentation

## 2017-08-24 DIAGNOSIS — Z79899 Other long term (current) drug therapy: Secondary | ICD-10-CM | POA: Insufficient documentation

## 2017-08-24 DIAGNOSIS — Z881 Allergy status to other antibiotic agents status: Secondary | ICD-10-CM | POA: Insufficient documentation

## 2017-08-24 DIAGNOSIS — Z760 Encounter for issue of repeat prescription: Secondary | ICD-10-CM | POA: Insufficient documentation

## 2017-08-24 DIAGNOSIS — K589 Irritable bowel syndrome without diarrhea: Secondary | ICD-10-CM | POA: Insufficient documentation

## 2017-08-24 DIAGNOSIS — M545 Low back pain: Secondary | ICD-10-CM | POA: Insufficient documentation

## 2017-08-24 DIAGNOSIS — F1290 Cannabis use, unspecified, uncomplicated: Secondary | ICD-10-CM | POA: Insufficient documentation

## 2017-08-24 DIAGNOSIS — G8929 Other chronic pain: Secondary | ICD-10-CM | POA: Insufficient documentation

## 2017-08-24 DIAGNOSIS — K50111 Crohn's disease of large intestine with rectal bleeding: Secondary | ICD-10-CM | POA: Insufficient documentation

## 2017-08-24 DIAGNOSIS — F332 Major depressive disorder, recurrent severe without psychotic features: Secondary | ICD-10-CM | POA: Insufficient documentation

## 2017-08-24 MED ORDER — DICYCLOMINE HCL 20 MG PO TABS: 20.0000 mg | ORAL_TABLET | Freq: Four times a day (QID) | ORAL | 1 refills | Status: DC

## 2017-08-24 MED ORDER — FAMOTIDINE 40 MG PO TABS: 40.0000 mg | ORAL_TABLET | Freq: Every day | ORAL | 3 refills | Status: DC

## 2017-08-24 MED FILL — DICYCLOMINE 20 MG TABLET: 20 | 30 days supply | Qty: 120 | Fill #0

## 2017-08-24 MED FILL — FAMOTIDINE 40 MG TABLET: 40 | 30 days supply | Qty: 30 | Fill #0

## 2017-08-24 MED FILL — traMADol HCL 50 MG TABS: 50 | 10 days supply | Qty: 30 | Fill #0

## 2017-08-24 NOTE — Patient Instructions (Signed)
Back Pain, Adult Many adults have back pain from time to time. Common causes of back pain include:  A strained muscle or ligament.  Wear and tear (degeneration) of the spinal disks.  Arthritis.  A hit to the back.  Back pain can be short-lived (acute) or last a long time (chronic). A physical exam, lab tests, and imaging studies may be done to find the cause of your pain. Follow these instructions at home: Managing pain and stiffness  Take over-the-counter and prescription medicines only as told by your health care provider.  If directed, apply heat to the affected area as often as told by your health care provider. Use the heat source that your health care provider recommends, such as a moist heat pack or a heating pad. ? Place a towel between your skin and the heat source. ? Leave the heat on for 20-30 minutes. ? Remove the heat if your skin turns bright red. This is especially important if you are unable to feel pain, heat, or cold. You have a greater risk of getting burned.  If directed, apply ice to the injured area: ? Put ice in a plastic bag. ? Place a towel between your skin and the bag. ? Leave the ice on for 20 minutes, 2-3 times a day for the first 2-3 days. Activity  Do not stay in bed. Resting more than 1-2 days can delay your recovery.  Take short walks on even surfaces as soon as you are able. Try to increase the length of time you walk each day.  Do not sit, drive, or stand in one place for more than 30 minutes at a time. Sitting or standing for long periods of time can put stress on your back.  Use proper lifting techniques. When you bend and lift, use positions that put less stress on your back: ? Bend your knees. ? Keep the load close to your body. ? Avoid twisting.  Exercise regularly as told by your health care provider. Exercising will help your back heal faster. This also helps prevent back injuries by keeping muscles strong and flexible.  Your health  care provider may recommend that you see a physical therapist. This person can help you come up with a safe exercise program. Do any exercises as told by your physical therapist. Lifestyle  Maintain a healthy weight. Extra weight puts stress on your back and makes it difficult to have good posture.  Avoid activities or situations that make you feel anxious or stressed. Learn ways to manage anxiety and stress. One way to manage stress is through exercise. Stress and anxiety increase muscle tension and can make back pain worse. General instructions  Sleep on a firm mattress in a comfortable position. Try lying on your side with your knees slightly bent. If you lie on your back, put a pillow under your knees.  Follow your treatment plan as told by your health care provider. This may include: ? Cognitive or behavioral therapy. ? Acupuncture or massage therapy. ? Meditation or yoga. Contact a health care provider if:  You have pain that is not relieved with rest or medicine.  You have increasing pain going down into your legs or buttocks.  Your pain does not improve in 2 weeks.  You have pain at night.  You lose weight.  You have a fever or chills. Get help right away if:  You develop new bowel or bladder control problems.  You have unusual weakness or numbness in your arms   or legs.  You develop nausea or vomiting.  You develop abdominal pain.  You feel faint. Summary  Many adults have back pain from time to time. A physical exam, lab tests, and imaging studies may be done to find the cause of your pain.  Use proper lifting techniques. When you bend and lift, use positions that put less stress on your back.  Take over-the-counter and prescription medicines and apply heat or ice as directed by your health care provider. This information is not intended to replace advice given to you by your health care provider. Make sure you discuss any questions you have with your health care  provider. Document Released: 05/11/2005 Document Revised: 06/15/2016 Document Reviewed: 06/15/2016 Elsevier Interactive Patient Education  2018 [employer]  Chronic Back Pain When back pain lasts longer than 3 months, it is called chronic back pain.The cause of your back pain may not be known. Some common causes include:  Wear and tear (degenerative disease) of the bones, ligaments, or disks in your back.  Inflammation and stiffness in your back (arthritis).  People who have chronic back pain often go through certain periods in which the pain is more intense (flare-ups). Many people can learn to manage the pain with home care. Follow these instructions at home: Pay attention to any changes in your symptoms. Take these actions to help with your pain: Activity  Avoid bending and activities that make the problem worse.  Do not sit or stand in one place for long periods of time.  Take brief periods of rest throughout the day. This will reduce your pain. Resting in a lying or standing position is usually better than sitting to rest.  When you are resting for longer periods, mix in some mild activity or stretching between periods of rest. This will help to prevent stiffness and pain.  Get regular exercise. Ask your health care provider what activities are safe for you.  Do not lift anything that is heavier than 10 lb (4.5 kg). Always use proper lifting technique, which includes: ? Bending your knees. ? Keeping the load close to your body. ? Avoiding twisting. Managing pain  If directed, apply ice to the painful area. Your health care provider may recommend applying ice during the first 24-48 hours after a flare-up begins. ? Put ice in a plastic bag. ? Place a towel between your skin and the bag. ? Leave the ice on for 20 minutes, 2-3 times per day.  After icing, apply heat to the affected area as often as told by your health care provider. Use the heat source that your health care  provider recommends, such as a moist heat pack or a heating pad. ? Place a towel between your skin and the heat source. ? Leave the heat on for 20-30 minutes. ? Remove the heat if your skin turns bright red. This is especially important if you are unable to feel pain, heat, or cold. You may have a greater risk of getting burned.  Try soaking in a warm tub.  Take over-the-counter and prescription medicines only as told by your health care provider.  Keep all follow-up visits as told by your health care provider. This is important. Contact a health care provider if:  You have pain that is not relieved with rest or medicine. Get help right away if:  You have weakness or numbness in one or both of your legs or feet.  You have trouble controlling your bladder or your bowels.  You  have nausea or vomiting.  You have pain in your abdomen.  You have shortness of breath or you faint. This information is not intended to replace advice given to you by your health care provider. Make sure you discuss any questions you have with your health care provider. Document Released: 06/18/2004 Document Revised: 09/19/2015 Document Reviewed: 10/29/2014 Elsevier Interactive Patient Education  2018 [employer].

## 2017-08-24 NOTE — Progress Notes (Signed)
Assessment & Plan:  Ross Schwartz was seen today for establish care, back pain and medication refill.  Diagnoses and all orders for this visit:  Crohn's disease of colon with rectal bleeding (HCC) -     dicyclomine (BENTYL) 20 MG tablet; Take 1 tablet (20 mg total) by mouth 4 (four) times daily. -     famotidine (PEPCID) 40 MG tablet; Take 1 tablet (40 mg total) by mouth daily. Avoid NSAIDS, goody powder, ASA STOP SMOKING  Chronic BACK PAIN  May alternate with heat and ice application for pain relief. May also take acetaminophen for back pain. Other alternatives include massage, acupuncture and water aerobics.  You must stay active and avoid a sedentary lifestyle.    Severe episode of recurrent major depressive disorder, without psychotic features [facility]) Behavioral Health Resources:   What if I or someone I know is in crisis?  . If you are thinking about harming yourself or having thoughts of suicide, or if you know someone who is, seek help right away.  . Call your doctor or mental health care provider.  . Call 911 or go to a hospital emergency room to get immediate help, or ask a friend or family member to help you do these things.  . Call the [place] National Suicide Prevention Lifeline's toll-free, 24-hour hotline at 1-800-273-TALK 657 698 7836) or TTY: 1-800-799-4 TTY 215-461-6056) to talk to a trained counselor.  . If you are in crisis, make sure you are not left alone.   . If someone else is in crisis, make sure he or she is not left alone   24 Hour Availability  [facility]  [address], [place], [place] [address]  [phone] or [phone]  [facility] [employer] (Domestic Violence, Rape & Victim Assistance [phone]  [facility] [address]  [address] [place], [place]  [address]               [phone] or [phone]  [facility] [place] [facility]    (ONLY from 8am-4pm)     [phone]  [facility] (24/7)   [phone]  [place] National Suicide Hotline   (616)839-8482 Len Childs)  Support from local police to aid getting patient to hospital (http://www.[place].gov/index.aspx?page=2797)         ONGOING BEHAVIORAL HEALTH SUPPORT FOR UNINSURED and UNDERINSURED:  [facility]  (718)461-7819   [address]  Walk-in first time, Monday-Friday, 8:30am-5:00pm  *Bring snack, drink, something to do, long wait at first visit, they do have pharmacy for behavioral health medications/ Bring own interpreter at first visit, if needed  [employer] [facility] [employer]  [phone]  [address]  Walk-in Monday-Friday, 8:30am-12pm & 1-2:30pm  *pacientes que hablen espanol, favor comunicarse con el Sr. [place], extension 2244 o la [address], extension 3331 para hacer [facility]:  (509)[phone] or kellinfoundation@gmail .com  [address], [address]  Call or email, may self-refer  * uninsured/underinsured, [phone], have both mental health and substance use challenges   [facility]:  Phone [phone]; Fax [phone]  *Call to schedule an appointment  3rd Floor located @?[address], corner of [address]  Mon-Thursday: 8:30am-8:00pm Friday: 8:30am-7:00pm  * Be sure to park in a space labeled "Psychology Department," located to the right of the main door of the building. Enter the main doors facing the parking lot and take the elevator or stairs  to the 3rd Floor.   [facility]:  [phone] or  [phone] (24/hour helpline)  [address]  Call to make appointment, tends to be a long wait to begin services, depending on insurance    [facility]  [phone] ??  *Call to schedule an appointment   [address]  Monday-Friday, 8:00am-5:00pm            [facility]  [phone], [place]  Monday-Friday, walk-in 8am-3pm  First appointment is assessment, then will make appointment for psychiatry       Patient has been counseled on age-appropriate routine health concerns for screening and prevention. These are reviewed and up-to-date. Referrals have been placed accordingly. Immunizations are up-to-date or declined.    Subjective:   Chief Complaint  Patient presents with  . Establish Care    Pt is here to establish care and follow-up on his rectal bleeding. Pt. stated his stomach still hurts and saw blood in his stool yesterday and today.   . Back Pain    Pt. stated he have back pain.   . Medication Refill   HPI Ross Schwartz 52 y.o. male presents to office today for follow up: rectal bleeding and to establish care. He does not have insurance. He was instructed to speak with the financial counselor for financial assistance.   Crohns Disease He has a history of Crohns and IBS  Has has not seen a gastroenterologist in several years. He is overdue for a colonoscopy. Today he endorses infrequent rectal bleeding (blood on the tissue). He endorses abdominal pain which is relieved with bentyl and GERD is relieved with pepcid. Denies constipation. The patient is currently having 3 bowel movements per day which are semisolid.  The patient currently denies significant abdominal pain or discomfort.  The patient does not have fever.  The patient does not have weight loss.  Last colonoscopy was 2000. He was instructed today to avoid Goody powders, ASA and NSAIDS.   Chronic Low back Pain Larey Seat during a skate boarding accident and injured his back 2 years ago. He was seen in here in the office a few weeks ago for hospital follow up to rectal bleeding and had complaints of low back pain at that time as well. He did not get his tramadol prescription refilled yet. It was prescribed by another provider. He smokes marijuana per his report. I instructed him that I would not be able to refill  tramadol or prescribe any narcotics if he smokes marijuana. He states he smokes it to increase his appetite. I instructed him that there are medications that can be given to increase his appetite besides smoking marijuana. We will discuss at his next office visit. He denies sciatica. Xray 09-18-2015 IMPRESSION: 1.  Minimal diffuse degenerative change.  No acute abnormality.     Depression He reports being under lots of stress but declines SSRI or meeting with social worker today.  He has taken prozac, paxil, xanax, librax, restoril in the past for his mood and insomnia. States his stress and mood are reltated to his stomach pain. He feel his stomach pain is getting better and so is his mood. He declines SSRI today. He denies any current thoughts of harming himself.   Depression screen PHQ 2/9 08/24/2017  Decreased Interest 3  Down, Depressed, Hopeless 2  PHQ - 2 Score 5  Altered sleeping 2  Tired, decreased energy 3  Change in appetite 3  Feeling bad or failure about yourself  2  Trouble concentrating 2  Moving slowly or fidgety/restless 3  Suicidal thoughts 1  PHQ-9 Score 21  Difficult doing work/chores -    Review of Systems  Constitutional: Negative for fever, malaise/fatigue and weight loss.  HENT: Negative.  Negative for nosebleeds.   Eyes: Negative.  Negative for blurred vision, double vision and photophobia.  Respiratory: Negative.  Negative for cough and shortness of breath.   Cardiovascular: Negative.  Negative for chest pain, palpitations and leg swelling.  Gastrointestinal: Positive for abdominal pain, blood in stool and heartburn. Negative for melena, nausea and vomiting.  Musculoskeletal: Positive for back pain. Negative for myalgias.  Neurological: Negative.  Negative for dizziness, focal weakness, seizures and headaches.  Psychiatric/Behavioral: Positive for depression. Negative for suicidal ideas. The patient does not have insomnia.     Past Medical History:    Diagnosis Date  . Crohn's disease (HCC)   . Depression   . Irritable bowel syndrome (IBS)     Past Surgical History:  Procedure Laterality Date  . APPENDECTOMY      Family History  Problem Relation Age of Onset  . Heart disease Father   . Heart disease Maternal Grandmother     Social History Reviewed with no changes to be made today.   Outpatient Medications Prior to Visit  Medication Sig Dispense Refill  . dicyclomine (BENTYL) 20 MG tablet Take 1 tablet (20 mg total) by mouth 4 (four) times daily. 120 tablet 1  . famotidine (PEPCID) 40 MG tablet Take 1 tablet (40 mg total) by mouth daily. 30 tablet 3  . Aspirin-Acetaminophen-Caffeine (GOODY HEADACHE PO) Take 1 packet by mouth daily as needed (PAIN).    Marland Kitchen traMADol (ULTRAM) 50 MG tablet Take 1 tablet (50 mg total) by mouth every 8 (eight) hours as needed. (Patient not taking: Reported on 08/24/2017) 30 tablet 0  . cyclobenzaprine (FLEXERIL) 10 MG tablet Take 1 tablet (10 mg total) by mouth at bedtime. (Patient not taking: Reported on 08/24/2017) 14 tablet 0   No facility-administered medications prior to visit.     Allergies  Allergen Reactions  . Amoxicillin     Has patient had a PCN reaction causing immediate rash, facial/tongue/throat swelling, SOB or lightheadedness with hypotension: Yes Has patient had a PCN reaction causing severe rash involving mucus membranes or skin necrosis: No Has patient had a PCN reaction that required hospitalization: No Has patient had a PCN reaction occurring within the last 10 years: No If all of the above answers are "NO", then may proceed with Cephalosporin use.   . Azithromycin   . Penicillins     Has patient had a PCN reaction causing immediate rash, facial/tongue/throat swelling, SOB or lightheadedness with hypotension: Yes Has patient had a PCN reaction causing severe rash involving mucus membranes or skin necrosis: No Has patient had a PCN reaction that required hospitalization:No Has  patient had a PCN reaction occurring within the last 10 years: No If all of the above answers are "NO", then may proceed with Cephalosporin use.   . Trazodone And Nefazodone Other (See Comments)    Prolonged erection       Objective:    BP 111/70 (BP Location: Right Arm, Patient Position: Sitting, Cuff Size: Normal)   Pulse 72   Temp 98.1 F (36.7 C) (Oral)   Ht 5\' 8"  (1.727 m)   Wt 132 lb 9.6 oz (60.1 kg)   SpO2 99%  BMI 20.16 kg/m  Wt Readings from Last 3 Encounters:  08/24/17 132 lb 9.6 oz (60.1 kg)  07/28/17 129 lb 12.8 oz (58.9 kg)  05/14/16 133 lb 3.2 oz (60.4 kg)    Physical Exam  Constitutional: He is oriented to person, place, and time. He appears well-developed and well-nourished. He is cooperative.  HENT:  Head: Normocephalic and atraumatic.  Eyes: EOM are normal.  Neck: Normal range of motion.  Cardiovascular: Normal rate, regular rhythm and normal heart sounds. Exam reveals no gallop and no friction rub.  No murmur heard. Pulmonary/Chest: Effort normal and breath sounds normal. No tachypnea. No respiratory distress. He has no decreased breath sounds. He has no wheezes. He has no rhonchi. He has no rales. He exhibits no tenderness.  Abdominal: Bowel sounds are normal.  Musculoskeletal: Normal range of motion. He exhibits no edema.       Lumbar back: He exhibits pain.  Neurological: He is alert and oriented to person, place, and time. Coordination normal.  Skin: Skin is warm and dry.  Psychiatric: He has a normal mood and affect. His speech is normal and behavior is normal. Judgment and thought content normal. Cognition and memory are normal. He expresses no homicidal and no suicidal ideation. He expresses no suicidal plans and no homicidal plans.  Nursing note and vitals reviewed.     Patient has been counseled extensively about nutrition and exercise as well as the importance of adherence with medications and regular follow-up. The patient was given clear  instructions to go to ER or return to medical center if symptoms don't improve, worsen or new problems develop. The patient verbalized understanding.   Follow-up: Return in about 1 month (around 09/21/2017) for Follow up back pain.   Claiborne Rigg, FNP-BC [facility] [place], [place] [phone]   08/24/2017, 10:37 PM

## 2017-09-22 ENCOUNTER — Encounter: Payer: Self-pay | Admitting: Nurse Practitioner

## 2017-09-22 ENCOUNTER — Ambulatory Visit: Payer: Self-pay | Attending: Nurse Practitioner | Admitting: Nurse Practitioner

## 2017-09-22 VITALS — BP 111/69 | HR 57 | Temp 98.5°F | Ht 68.0 in | Wt 130.8 lb

## 2017-09-22 DIAGNOSIS — Z79899 Other long term (current) drug therapy: Secondary | ICD-10-CM | POA: Insufficient documentation

## 2017-09-22 DIAGNOSIS — F329 Major depressive disorder, single episode, unspecified: Secondary | ICD-10-CM | POA: Insufficient documentation

## 2017-09-22 DIAGNOSIS — F17200 Nicotine dependence, unspecified, uncomplicated: Secondary | ICD-10-CM

## 2017-09-22 DIAGNOSIS — M545 Low back pain, unspecified: Secondary | ICD-10-CM

## 2017-09-22 DIAGNOSIS — Z7982 Long term (current) use of aspirin: Secondary | ICD-10-CM | POA: Insufficient documentation

## 2017-09-22 DIAGNOSIS — F17210 Nicotine dependence, cigarettes, uncomplicated: Secondary | ICD-10-CM | POA: Insufficient documentation

## 2017-09-22 DIAGNOSIS — G8929 Other chronic pain: Secondary | ICD-10-CM | POA: Insufficient documentation

## 2017-09-22 DIAGNOSIS — K5090 Crohn's disease, unspecified, without complications: Secondary | ICD-10-CM | POA: Insufficient documentation

## 2017-09-22 MED ORDER — DICLOFENAC SODIUM 1 % TD GEL: 2.0000 g | Freq: Four times a day (QID) | TRANSDERMAL | 1 refills | Status: AC

## 2017-09-22 MED ORDER — CYCLOBENZAPRINE HCL 10 MG PO TABS: 10.0000 mg | ORAL_TABLET | Freq: Three times a day (TID) | ORAL | 1 refills | Status: DC | PRN

## 2017-09-22 MED ORDER — CYCLOBENZAPRINE HCL 10 MG PO TABS: 10.0000 mg | ORAL_TABLET | Freq: Three times a day (TID) | ORAL | 0 refills | Status: DC | PRN

## 2017-09-22 MED FILL — CYCLOBENZAPRINE 10 MG TAB: 10 | 20 days supply | Qty: 60 | Fill #0

## 2017-09-22 MED FILL — DICLOFENAC SODIUM 1% GEL: 1 | 12 days supply | Qty: 100 | Fill #0

## 2017-09-22 MED FILL — DICYCLOMINE 20 MG TABLET: 20 | 30 days supply | Qty: 120 | Fill #1

## 2017-09-22 MED FILL — FAMOTIDINE 40 MG TABLET: 40 | 30 days supply | Qty: 30 | Fill #1

## 2017-09-22 NOTE — Progress Notes (Signed)
Assessment & Plan:  Ross Schwartz was seen today for follow-up.  Diagnoses and all orders for this visit:  Chronic bilateral low back pain without sciatica -     diclofenac sodium (VOLTAREN) 1 % GEL; Apply 2 g topically 4 (four) times daily. -     cyclobenzaprine (FLEXERIL) 10 MG tablet; Take 1 tablet (10 mg total) by mouth 3 (three) times daiIncrease dosely as needed for muscle spasms. Avoid NSAIDs/ASA  May alternate with heat and ice application for pain relief. May also alternate with XS acetaminophen as prescribed for back pain. Other alternatives include massage, acupuncture and water aerobics.  You must stay active and avoid a sedentary lifestyle.  Tobacco Dependence Kashaun was counseled on the dangers of tobacco use, and was advised to quit. Reviewed strategies to maximize success, including removing cigarettes and smoking materials from environment, stress management and support of family/friends as well as pharmacological alternatives including: Wellbutrin, Chantix, Nicotine patch, Nicotine gum or lozenges. Smoking cessation support: smoking cessation hotline: 1-800-QUIT-NOW.  Smoking cessation classes are also available through [facility]. Call [phone] or visit our website at [email or web address].   A total of 3 minutes was spent on counseling for smoking cessation and Lawyer is not ready to quit.   Patient has been counseled on age-appropriate routine health concerns for screening and prevention. These are reviewed and up-to-date. Referrals have been placed accordingly. Immunizations are up-to-date or declined.    Subjective:   Chief Complaint  Patient presents with  . Follow-up    Pt. is here for a follow-up on back pain. Pt. stated his back is still the same pain and there has been no improvement. Pt. did not apply for financial assitance yet.    HPI Ross Schwartz 52 y.o. male presents to office today for follow up to back pain. He still has  not followed up with the financial resources counselor and therefore has not been approved for the following:[employer] DISCOUNT Program.  Crohn's disease Much improved on Pepcid and Bentyl.  We will continue with this as I am unable to refer him to gastroenterology until he has spoken with the financial counselor.  Chronic low back pain Reports Flexeril has helped to relieve his symptoms of low back pain in the past so I will refill Flexeril today at an increased dose .  We will also try a diclofenac gel as he has a history of IBS/Crohn's as I am not with comfortable with prescribing NSAIDs at this time.  Review of Systems  Constitutional: Negative.  Negative for chills, fever, malaise/fatigue and weight loss.  Respiratory: Negative.  Negative for cough and shortness of breath.   Cardiovascular: Negative.  Negative for chest pain, claudication and leg swelling.  Gastrointestinal: Positive for abdominal pain.  Genitourinary: Negative.  Negative for dysuria and flank pain.  Musculoskeletal: Positive for back pain and myalgias. Negative for falls.  Skin: Negative.  Negative for rash.  Neurological: Negative.  Negative for dizziness, tingling, sensory change, focal weakness, weakness and headaches.  Psychiatric/Behavioral: Positive for depression. Negative for suicidal ideas.    Past Medical History:  Diagnosis Date  . Crohn's disease (HCC)   . Depression   . Irritable bowel syndrome (IBS)     Past Surgical History:  Procedure Laterality Date  . APPENDECTOMY      Family History  Problem Relation Age of Onset  . Heart disease Father   . Heart disease Maternal Grandmother     Social History Reviewed  with no changes to be made today.   Outpatient Medications Prior to Visit  Medication Sig Dispense Refill  . Aspirin-Acetaminophen-Caffeine (GOODY HEADACHE PO) Take 1 packet by mouth daily as needed (PAIN).    Marland Kitchen dicyclomine (BENTYL) 20 MG tablet Take 1 tablet (20 mg total) by  mouth 4 (four) times daily. 120 tablet 1  . famotidine (PEPCID) 40 MG tablet Take 1 tablet (40 mg total) by mouth daily. 30 tablet 3  . traMADol (ULTRAM) 50 MG tablet Take 1 tablet (50 mg total) by mouth every 8 (eight) hours as needed. 30 tablet 0   No facility-administered medications prior to visit.     Allergies  Allergen Reactions  . Amoxicillin     Has patient had a PCN reaction causing immediate rash, facial/tongue/throat swelling, SOB or lightheadedness with hypotension: Yes Has patient had a PCN reaction causing severe rash involving mucus membranes or skin necrosis: No Has patient had a PCN reaction that required hospitalization: No Has patient had a PCN reaction occurring within the last 10 years: No If all of the above answers are "NO", then may proceed with Cephalosporin use.   . Azithromycin   . Penicillins     Has patient had a PCN reaction causing immediate rash, facial/tongue/throat swelling, SOB or lightheadedness with hypotension: Yes Has patient had a PCN reaction causing severe rash involving mucus membranes or skin necrosis: No Has patient had a PCN reaction that required hospitalization:No Has patient had a PCN reaction occurring within the last 10 years: No If all of the above answers are "NO", then may proceed with Cephalosporin use.   . Trazodone And Nefazodone Other (See Comments)    Prolonged erection       Objective:    BP 111/69 (BP Location: Left Arm, Patient Position: Sitting, Cuff Size: Normal)   Pulse (!) 57   Temp 98.5 F (36.9 C) (Oral)   Ht 5\' 8"  (1.727 m)   Wt 130 lb 12.8 oz (59.3 kg)   SpO2 100%   BMI 19.89 kg/m  Wt Readings from Last 3 Encounters:  09/22/17 130 lb 12.8 oz (59.3 kg)  08/24/17 132 lb 9.6 oz (60.1 kg)  07/28/17 129 lb 12.8 oz (58.9 kg)    Physical Exam  Constitutional: He is oriented to person, place, and time. He appears well-developed and well-nourished. He is cooperative.  HENT:  Head: Normocephalic and  atraumatic.  Neck: Normal range of motion.  Cardiovascular: Normal rate, regular rhythm and normal heart sounds. Exam reveals no gallop and no friction rub.  No murmur heard. Pulmonary/Chest: Effort normal and breath sounds normal. No tachypnea. No respiratory distress. He has no decreased breath sounds. He has no wheezes. He has no rhonchi. He has no rales. He exhibits no tenderness.  Abdominal: Bowel sounds are normal.  Musculoskeletal: He exhibits no edema, tenderness or deformity.       Lumbar back: He exhibits pain.  Neurological: He is alert and oriented to person, place, and time. Coordination normal.  Skin: Skin is warm and dry.  Psychiatric: He has a normal mood and affect. His behavior is normal. Judgment and thought content normal.  Nursing note and vitals reviewed.      Patient has been counseled extensively about nutrition and exercise as well as the importance of adherence with medications and regular follow-up. The patient was given clear instructions to go to ER or return to medical center if symptoms don't improve, worsen or new problems develop. The patient verbalized understanding.  Follow-up: Return in about 1 month (around 10/20/2017) for back pain.   Claiborne Rigg, FNP-BC [facility] [place], [place] [phone]   09/22/2017, 5:48 PM

## 2017-09-22 NOTE — Patient Instructions (Signed)
Back Exercises If you have pain in your back, do these exercises 2-3 times each day or as told by your doctor. When the pain goes away, do the exercises once each day, but repeat the steps more times for each exercise (do more repetitions). If you do not have pain in your back, do these exercises once each day or as told by your doctor. Exercises Single Knee to Chest  Do these steps 3-5 times in a row for each leg: 1. Lie on your back on a firm bed or the floor with your legs stretched out. 2. Bring one knee to your chest. 3. Hold your knee to your chest by grabbing your knee or thigh. 4. Pull on your knee until you feel a gentle stretch in your lower back. 5. Keep doing the stretch for 10-30 seconds. 6. Slowly let go of your leg and straighten it.  Pelvic Tilt  Do these steps 5-10 times in a row: 1. Lie on your back on a firm bed or the floor with your legs stretched out. 2. Bend your knees so they point up to the ceiling. Your feet should be flat on the floor. 3. Tighten your lower belly (abdomen) muscles to press your lower back against the floor. This will make your tailbone point up to the ceiling instead of pointing down to your feet or the floor. 4. Stay in this position for 5-10 seconds while you gently tighten your muscles and breathe evenly.  Cat-Cow  Do these steps until your lower back bends more easily: 1. Get on your hands and knees on a firm surface. Keep your hands under your shoulders, and keep your knees under your hips. You may put padding under your knees. 2. Let your head hang down, and make your tailbone point down to the floor so your lower back is round like the back of a cat. 3. Stay in this position for 5 seconds. 4. Slowly lift your head and make your tailbone point up to the ceiling so your back hangs low (sags) like the back of a cow. 5. Stay in this position for 5 seconds.  Press-Ups  Do these steps 5-10 times in a row: 1. Lie on your belly (face-down)  on the floor. 2. Place your hands near your head, about shoulder-width apart. 3. While you keep your back relaxed and keep your hips on the floor, slowly straighten your arms to raise the top half of your body and lift your shoulders. Do not use your back muscles. To make yourself more comfortable, you may change where you place your hands. 4. Stay in this position for 5 seconds. 5. Slowly return to lying flat on the floor.  Bridges  Do these steps 10 times in a row: 1. Lie on your back on a firm surface. 2. Bend your knees so they point up to the ceiling. Your feet should be flat on the floor. 3. Tighten your butt muscles and lift your butt off of the floor until your waist is almost as high as your knees. If you do not feel the muscles working in your butt and the back of your thighs, slide your feet 1-2 inches farther away from your butt. 4. Stay in this position for 3-5 seconds. 5. Slowly lower your butt to the floor, and let your butt muscles relax.  If this exercise is too easy, try doing it with your arms crossed over your chest. Belly Crunches  Do these steps 5-10 times in   a row: 1. Lie on your back on a firm bed or the floor with your legs stretched out. 2. Bend your knees so they point up to the ceiling. Your feet should be flat on the floor. 3. Cross your arms over your chest. 4. Tip your chin a little bit toward your chest but do not bend your neck. 5. Tighten your belly muscles and slowly raise your chest just enough to lift your shoulder blades a tiny bit off of the floor. 6. Slowly lower your chest and your head to the floor.  Back Lifts Do these steps 5-10 times in a row: 1. Lie on your belly (face-down) with your arms at your sides, and rest your forehead on the floor. 2. Tighten the muscles in your legs and your butt. 3. Slowly lift your chest off of the floor while you keep your hips on the floor. Keep the back of your head in line with the curve in your back. Look at  the floor while you do this. 4. Stay in this position for 3-5 seconds. 5. Slowly lower your chest and your face to the floor.  Contact a doctor if:  Your back pain gets a lot worse when you do an exercise.  Your back pain does not lessen 2 hours after you exercise. If you have any of these problems, stop doing the exercises. Do not do them again unless your doctor says it is okay. Get help right away if:  You have sudden, very bad back pain. If this happens, stop doing the exercises. Do not do them again unless your doctor says it is okay. This information is not intended to replace advice given to you by your health care provider. Make sure you discuss any questions you have with your health care provider. Document Released: 06/13/2010 Document Revised: 10/17/2015 Document Reviewed: 07/05/2014 Elsevier Interactive Patient Education  2018 [employer] Inc[employer]  Chronic Back Pain When back pain lasts longer than 3 months, it is called chronic back pain.The cause of your back pain may not be known. Some common causes include:  Wear and tear (degenerative disease) of the bones, ligaments, or disks in your back.  Inflammation and stiffness in your back (arthritis).  People who have chronic back pain often go through certain periods in which the pain is more intense (flare-ups). Many people can learn to manage the pain with home care. Follow these instructions at home: Pay attention to any changes in your symptoms. Take these actions to help with your pain: Activity  Avoid bending and activities that make the problem worse.  Do not sit or stand in one place for long periods of time.  Take brief periods of rest throughout the day. This will reduce your pain. Resting in a lying or standing position is usually better than sitting to rest.  When you are resting for longer periods, mix in some mild activity or stretching between periods of rest. This will help to prevent stiffness and pain.  Get  regular exercise. Ask your health care provider what activities are safe for you.  Do not lift anything that is heavier than 10 lb (4.5 kg). Always use proper lifting technique, which includes: ? Bending your knees. ? Keeping the load close to your body. ? Avoiding twisting. Managing pain  If directed, apply ice to the painful area. Your health care provider may recommend applying ice during the first 24-48 hours after a flare-up begins. ? Put ice in a plastic bag. ? Place   a towel between your skin and the bag. ? Leave the ice on for 20 minutes, 2-3 times per day.  After icing, apply heat to the affected area as often as told by your health care provider. Use the heat source that your health care provider recommends, such as a moist heat pack or a heating pad. ? Place a towel between your skin and the heat source. ? Leave the heat on for 20-30 minutes. ? Remove the heat if your skin turns bright red. This is especially important if you are unable to feel pain, heat, or cold. You may have a greater risk of getting burned.  Try soaking in a warm tub.  Take over-the-counter and prescription medicines only as told by your health care provider.  Keep all follow-up visits as told by your health care provider. This is important. Contact a health care provider if:  You have pain that is not relieved with rest or medicine. Get help right away if:  You have weakness or numbness in one or both of your legs or feet.  You have trouble controlling your bladder or your bowels.  You have nausea or vomiting.  You have pain in your abdomen.  You have shortness of breath or you faint. This information is not intended to replace advice given to you by your health care provider. Make sure you discuss any questions you have with your health care provider. Document Released: 06/18/2004 Document Revised: 09/19/2015 Document Reviewed: 10/29/2014 Elsevier Interactive Patient Education  2018 Elsevier  Inc.  

## 2017-11-02 ENCOUNTER — Encounter: Payer: Self-pay | Admitting: Nurse Practitioner

## 2017-11-02 ENCOUNTER — Ambulatory Visit: Payer: Self-pay | Attending: Nurse Practitioner | Admitting: Nurse Practitioner

## 2017-11-02 VITALS — BP 115/73 | HR 96 | Temp 98.6°F | Ht 68.0 in | Wt 133.2 lb

## 2017-11-02 DIAGNOSIS — M545 Low back pain, unspecified: Secondary | ICD-10-CM

## 2017-11-02 DIAGNOSIS — G8929 Other chronic pain: Secondary | ICD-10-CM

## 2017-11-02 DIAGNOSIS — F329 Major depressive disorder, single episode, unspecified: Secondary | ICD-10-CM | POA: Insufficient documentation

## 2017-11-02 DIAGNOSIS — Z79899 Other long term (current) drug therapy: Secondary | ICD-10-CM | POA: Insufficient documentation

## 2017-11-02 DIAGNOSIS — K5090 Crohn's disease, unspecified, without complications: Secondary | ICD-10-CM | POA: Insufficient documentation

## 2017-11-02 MED ORDER — DICLOFENAC SODIUM 1 % TD GEL: 4.0000 g | Freq: Four times a day (QID) | TRANSDERMAL | 1 refills | Status: AC

## 2017-11-02 MED ORDER — METHOCARBAMOL 750 MG PO TABS: 750.0000 mg | ORAL_TABLET | Freq: Four times a day (QID) | ORAL | 1 refills | Status: AC

## 2017-11-02 MED FILL — DICLOFENAC SODIUM 1% GEL: 1 | 6 days supply | Qty: 100 | Fill #0

## 2017-11-02 MED FILL — FAMOTIDINE 40 MG TABLET: 40 | 30 days supply | Qty: 30 | Fill #2

## 2017-11-02 MED FILL — METHOCARBAMOL 750 MG TABS: 750 | 22 days supply | Qty: 90 | Fill #0

## 2017-11-02 MED FILL — CYCLOBENZAPRINE 10 MG TAB: 10 | 20 days supply | Qty: 60 | Fill #1

## 2017-11-02 NOTE — Progress Notes (Signed)
Assessment & Plan:  Ross Schwartz was seen today for follow-up and medication refill.  Diagnoses and all orders for this visit:  Chronic bilateral low back pain without sciatica -     diclofenac sodium (VOLTAREN) 1 % GEL; Apply 4 g topically 4 (four) times daily. -     methocarbamol (ROBAXIN-750) 750 MG tablet; Take 1 tablet (750 mg total) by mouth 4 (four) times daily. He continues to smoke. Have recommended smoking cessation aids however he declines.   Patient has been counseled on age-appropriate routine health concerns for screening and prevention. These are reviewed and up-to-date. Referrals have been placed accordingly. Immunizations are up-to-date or declined.    Subjective:   Chief Complaint  Patient presents with  . Follow-up    Pt. is here for follow-up on back pain. Pt. stated the back pain is still the same.   . Medication Refill   HPI Ross Schwartz 52 y.o. male presents to office today for follow up to back pain.    Chronic Low Back Pain Currently stable however he feels as if the flexeril is not working as well as it has in the past to help control pain. Will switch to robaxin and increase voltaren gel dose. He has a history of GI bleed and +FOBT. Will not prescribe NSAIDs.  He endorses wearing a back brace at work with some relief of back pain. He endorses constipation likely related to pepcid/bentyl. I have recommended he increase his water intake and decrease the amount of bentyl he is taking. Back pain does not radiate. Aggravating factors: bending; pulling, lifting heavy objects, prolonged sitting or standing.    Review of Systems  Constitutional: Negative for fever, malaise/fatigue and weight loss.  HENT: Negative.  Negative for nosebleeds.   Eyes: Negative.  Negative for blurred vision, double vision and photophobia.  Respiratory: Negative.  Negative for cough and shortness of breath.   Cardiovascular: Negative.  Negative for chest pain, palpitations and leg  swelling.  Gastrointestinal: Positive for abdominal pain (controlled with bentyl), constipation and heartburn. Negative for blood in stool, nausea and vomiting.  Musculoskeletal: Positive for back pain and myalgias.  Neurological: Negative.  Negative for dizziness, focal weakness, seizures and headaches.  Psychiatric/Behavioral: Positive for depression. Negative for suicidal ideas.    Past Medical History:  Diagnosis Date  . Crohn's disease (HCC)   . Depression   . Irritable bowel syndrome (IBS)     Past Surgical History:  Procedure Laterality Date  . APPENDECTOMY      Family History  Problem Relation Age of Onset  . Heart disease Father   . Heart disease Maternal Grandmother     Social History Reviewed with no changes to be made today.   Outpatient Medications Prior to Visit  Medication Sig Dispense Refill  . Aspirin-Acetaminophen-Caffeine (GOODY HEADACHE PO) Take 1 packet by mouth daily as needed (PAIN).    Marland Kitchen dicyclomine (BENTYL) 20 MG tablet Take 1 tablet (20 mg total) by mouth 4 (four) times daily. 120 tablet 1  . famotidine (PEPCID) 40 MG tablet Take 1 tablet (40 mg total) by mouth daily. 30 tablet 3  . cyclobenzaprine (FLEXERIL) 10 MG tablet Take 1 tablet (10 mg total) by mouth 3 (three) times daily as needed for muscle spasms. 60 tablet 1   No facility-administered medications prior to visit.     Allergies  Allergen Reactions  . Amoxicillin     Has patient had a PCN reaction causing immediate rash, facial/tongue/throat swelling, SOB or  lightheadedness with hypotension: Yes Has patient had a PCN reaction causing severe rash involving mucus membranes or skin necrosis: No Has patient had a PCN reaction that required hospitalization: No Has patient had a PCN reaction occurring within the last 10 years: No If all of the above answers are "NO", then may proceed with Cephalosporin use.   . Azithromycin   . Penicillins     Has patient had a PCN reaction causing  immediate rash, facial/tongue/throat swelling, SOB or lightheadedness with hypotension: Yes Has patient had a PCN reaction causing severe rash involving mucus membranes or skin necrosis: No Has patient had a PCN reaction that required hospitalization:No Has patient had a PCN reaction occurring within the last 10 years: No If all of the above answers are "NO", then may proceed with Cephalosporin use.   . Trazodone And Nefazodone Other (See Comments)    Prolonged erection       Objective:    BP 115/73 (BP Location: Left Arm, Patient Position: Sitting, Cuff Size: Normal)   Pulse 96   Temp 98.6 F (37 C) (Oral)   Ht 5\' 8"  (1.727 m)   Wt 133 lb 3.2 oz (60.4 kg)   SpO2 98%   BMI 20.25 kg/m  Wt Readings from Last 3 Encounters:  11/02/17 133 lb 3.2 oz (60.4 kg)  09/22/17 130 lb 12.8 oz (59.3 kg)  08/24/17 132 lb 9.6 oz (60.1 kg)    Physical Exam  Constitutional: He is oriented to person, place, and time. He appears well-developed and well-nourished. He is cooperative.  HENT:  Head: Normocephalic and atraumatic.  Eyes: EOM are normal.  Neck: Normal range of motion.  Cardiovascular: Normal rate, regular rhythm and normal heart sounds. Exam reveals no gallop and no friction rub.  No murmur heard. Pulmonary/Chest: Effort normal and breath sounds normal. No tachypnea. No respiratory distress. He has no decreased breath sounds. He has no wheezes. He has no rhonchi. He has no rales. He exhibits no tenderness.  Abdominal: Soft. Bowel sounds are normal.  Musculoskeletal: Normal range of motion. He exhibits no edema.       Lumbar back: He exhibits pain.  Neurological: He is alert and oriented to person, place, and time. Coordination normal.  Skin: Skin is warm and dry.  Psychiatric: He has a normal mood and affect. His behavior is normal. Judgment and thought content normal.  Nursing note and vitals reviewed.     Patient has been counseled extensively about nutrition and exercise as well  as the importance of adherence with medications and regular follow-up. The patient was given clear instructions to go to ER or return to medical center if symptoms don't improve, worsen or new problems develop. The patient verbalized understanding.   Follow-up: Return in about 3 months (around 02/02/2018) for FASTING labs and Physical.   Claiborne Rigg, FNP-BC [facility] [place], [place] [phone]   11/02/2017, 2:47 PM

## 2017-11-02 NOTE — Patient Instructions (Signed)
Back Exercises If you have pain in your back, do these exercises 2-3 times each day or as told by your doctor. When the pain goes away, do the exercises once each day, but repeat the steps more times for each exercise (do more repetitions). If you do not have pain in your back, do these exercises once each day or as told by your doctor. Exercises Single Knee to Chest  Do these steps 3-5 times in a row for each leg: 1. Lie on your back on a firm bed or the floor with your legs stretched out. 2. Bring one knee to your chest. 3. Hold your knee to your chest by grabbing your knee or thigh. 4. Pull on your knee until you feel a gentle stretch in your lower back. 5. Keep doing the stretch for 10-30 seconds. 6. Slowly let go of your leg and straighten it.  Pelvic Tilt  Do these steps 5-10 times in a row: 1. Lie on your back on a firm bed or the floor with your legs stretched out. 2. Bend your knees so they point up to the ceiling. Your feet should be flat on the floor. 3. Tighten your lower belly (abdomen) muscles to press your lower back against the floor. This will make your tailbone point up to the ceiling instead of pointing down to your feet or the floor. 4. Stay in this position for 5-10 seconds while you gently tighten your muscles and breathe evenly.  Cat-Cow  Do these steps until your lower back bends more easily: 1. Get on your hands and knees on a firm surface. Keep your hands under your shoulders, and keep your knees under your hips. You may put padding under your knees. 2. Let your head hang down, and make your tailbone point down to the floor so your lower back is round like the back of a cat. 3. Stay in this position for 5 seconds. 4. Slowly lift your head and make your tailbone point up to the ceiling so your back hangs low (sags) like the back of a cow. 5. Stay in this position for 5 seconds.  Press-Ups  Do these steps 5-10 times in a row: 1. Lie on your belly (face-down)  on the floor. 2. Place your hands near your head, about shoulder-width apart. 3. While you keep your back relaxed and keep your hips on the floor, slowly straighten your arms to raise the top half of your body and lift your shoulders. Do not use your back muscles. To make yourself more comfortable, you may change where you place your hands. 4. Stay in this position for 5 seconds. 5. Slowly return to lying flat on the floor.  Bridges  Do these steps 10 times in a row: 1. Lie on your back on a firm surface. 2. Bend your knees so they point up to the ceiling. Your feet should be flat on the floor. 3. Tighten your butt muscles and lift your butt off of the floor until your waist is almost as high as your knees. If you do not feel the muscles working in your butt and the back of your thighs, slide your feet 1-2 inches farther away from your butt. 4. Stay in this position for 3-5 seconds. 5. Slowly lower your butt to the floor, and let your butt muscles relax.  If this exercise is too easy, try doing it with your arms crossed over your chest. Belly Crunches  Do these steps 5-10 times in   a row: 1. Lie on your back on a firm bed or the floor with your legs stretched out. 2. Bend your knees so they point up to the ceiling. Your feet should be flat on the floor. 3. Cross your arms over your chest. 4. Tip your chin a little bit toward your chest but do not bend your neck. 5. Tighten your belly muscles and slowly raise your chest just enough to lift your shoulder blades a tiny bit off of the floor. 6. Slowly lower your chest and your head to the floor.  Back Lifts Do these steps 5-10 times in a row: 1. Lie on your belly (face-down) with your arms at your sides, and rest your forehead on the floor. 2. Tighten the muscles in your legs and your butt. 3. Slowly lift your chest off of the floor while you keep your hips on the floor. Keep the back of your head in line with the curve in your back. Look at  the floor while you do this. 4. Stay in this position for 3-5 seconds. 5. Slowly lower your chest and your face to the floor.  Contact a doctor if:  Your back pain gets a lot worse when you do an exercise.  Your back pain does not lessen 2 hours after you exercise. If you have any of these problems, stop doing the exercises. Do not do them again unless your doctor says it is okay. Get help right away if:  You have sudden, very bad back pain. If this happens, stop doing the exercises. Do not do them again unless your doctor says it is okay. This information is not intended to replace advice given to you by your health care provider. Make sure you discuss any questions you have with your health care provider. Document Released: 06/13/2010 Document Revised: 10/17/2015 Document Reviewed: 07/05/2014 Elsevier Interactive Patient Education  2018 Elsevier Inc[employer]  Back Pain, Adult Back pain is very common. The pain often gets better over time. The cause of back pain is usually not dangerous. Most people can learn to manage their back pain on their own. Follow these instructions at home: Watch your back pain for any changes. The following actions may help to lessen any pain you are feeling:  Stay active. Start with short walks on flat ground if you can. Try to walk farther each day.  Exercise regularly as told by your doctor. Exercise helps your back heal faster. It also helps avoid future injury by keeping your muscles strong and flexible.  Do not sit, drive, or stand in one place for more than 30 minutes.  Do not stay in bed. Resting more than 1-2 days can slow down your recovery.  Be careful when you bend or lift an object. Use good form when lifting: ? Bend at your knees. ? Keep the object close to your body. ? Do not twist.  Sleep on a firm mattress. Lie on your side, and bend your knees. If you lie on your back, put a pillow under your knees.  Take medicines only as told by your  doctor.  Put ice on the injured area. ? Put ice in a plastic bag. ? Place a towel between your skin and the bag. ? Leave the ice on for 20 minutes, 2-3 times a day for the first 2-3 days. After that, you can switch between ice and heat packs.  Avoid feeling anxious or stressed. Find good ways to deal with stress, such as exercise.    Maintain a healthy weight. Extra weight puts stress on your back.  Contact a doctor if:  You have pain that does not go away with rest or medicine.  You have worsening pain that goes down into your legs or buttocks.  You have pain that does not get better in one week.  You have pain at night.  You lose weight.  You have a fever or chills. Get help right away if:  You cannot control when you poop (bowel movement) or pee (urinate).  Your arms or legs feel weak.  Your arms or legs lose feeling (numbness).  You feel sick to your stomach (nauseous) or throw up (vomit).  You have belly (abdominal) pain.  You feel like you may pass out (faint). This information is not intended to replace advice given to you by your health care provider. Make sure you discuss any questions you have with your health care provider. Document Released: 10/28/2007 Document Revised: 10/17/2015 Document Reviewed: 09/12/2013 Elsevier Interactive Patient Education  2018 Elsevier Inc.  

## 2017-11-26 ENCOUNTER — Encounter: Payer: Self-pay | Admitting: Nurse Practitioner

## 2017-11-26 ENCOUNTER — Ambulatory Visit: Payer: Self-pay | Attending: Nurse Practitioner | Admitting: Nurse Practitioner

## 2017-11-26 VITALS — BP 97/65 | HR 65 | Ht 68.0 in | Wt 128.2 lb

## 2017-11-26 DIAGNOSIS — K581 Irritable bowel syndrome with constipation: Secondary | ICD-10-CM | POA: Insufficient documentation

## 2017-11-26 DIAGNOSIS — Z880 Allergy status to penicillin: Secondary | ICD-10-CM | POA: Insufficient documentation

## 2017-11-26 DIAGNOSIS — Z881 Allergy status to other antibiotic agents status: Secondary | ICD-10-CM | POA: Insufficient documentation

## 2017-11-26 DIAGNOSIS — M25512 Pain in left shoulder: Secondary | ICD-10-CM

## 2017-11-26 DIAGNOSIS — K5903 Drug induced constipation: Secondary | ICD-10-CM

## 2017-11-26 NOTE — Patient Instructions (Signed)
Muscle Strain A muscle strain (pulled muscle) happens when a muscle is stretched beyond normal length. It happens when a sudden, violent force stretches your muscle too far. Usually, a few of the fibers in your muscle are torn. Muscle strain is common in athletes. Recovery usually takes 1-2 weeks. Complete healing takes 5-6 weeks. Follow these instructions at home:  Follow the PRICE method of treatment to help your injury get better. Do this the first 2-3 days after the injury: ? Protect. Protect the muscle to keep it from getting injured again. ? Rest. Limit your activity and rest the injured body part. ? Ice. Put ice in a plastic bag. Place a towel between your skin and the bag. Then, apply the ice and leave it on from 15-20 minutes each hour. After the third day, switch to moist heat packs. ? Compression. Use a splint or elastic bandage on the injured area for comfort. Do not put it on too tightly. ? Elevate. Keep the injured body part above the level of your heart.  Only take medicine as told by your doctor.  Warm up before doing exercise to prevent future muscle strains. Contact a doctor if:  You have more pain or puffiness (swelling) in the injured area.  You feel numbness, tingling, or notice a loss of strength in the injured area. This information is not intended to replace advice given to you by your health care provider. Make sure you discuss any questions you have with your health care provider. Document Released: 02/18/2008 Document Revised: 10/17/2015 Document Reviewed: 12/08/2012 Elsevier Interactive Patient Education  2017 Elsevier Inc.  

## 2017-11-26 NOTE — Progress Notes (Signed)
Assessment & Plan:  Landy was seen today for shoulder pain.  Diagnoses and all orders for this visit:  Acute pain of left shoulder Continue voltaren gel and robaxin for pain relief.  Rest the affected area and apply a sling to the right arm to help avoid injury Alternate with heat and cold application to affected area.   Drug-induced constipation Take Bentyl only as needed. Increase water and fiber intake.   Patient has been counseled on age-appropriate routine health concerns for screening and prevention. These are reviewed and up-to-date. Referrals have been placed accordingly. Immunizations are up-to-date or declined.    Subjective:   Chief Complaint  Patient presents with  . Shoulder Pain    Pt. is here for his right shoulder pain. 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 Pt. do not have the financial asisstance. Pt. is also having constipation.    HPI Ross Schwartz 52 y.o. male presents to office today with right shoulder pain and constipation.   Shoulder Pain Patient complaints of right shoulder pain. This is evaluated as a personal injury. The pain is described as aching, dull and sharp.  The onset of the pain was 5 days ago.  The pain occurs continuously.  Location is acromioclavicular joint. No history of dislocation. Symptoms are aggravated by all activities. Symptoms are diminished by  rest.   Limited activities include: all activities. moderate stiffness is reported. Patient has not missed work. .He has applied ice to the area but not heat.  He is also taking Robaxin 750mg  QID and using voltaren gel application to the shoulder with little relief.   Patient is currently uninsured and can not afford the out of pocket cost for xray. I have instructed him on several occasions to apply for financial assistance.   Constipation Patient complains of constipation.   Onset was a few weeks ago Defecation has been difficult and incomplete. Co-Morbid  conditions:current medication Bentyl and irritable bowel syndrome. Symptoms have been stable. Current Health Habits: Eating fiber? no Exercise?no Water intake? yes Current OTC/RX therapy has been stimulant (OTC) which has been somewhat effective.   Review of Systems  Constitutional: Negative for fever, malaise/fatigue and weight loss.  HENT: Negative.  Negative for nosebleeds.   Eyes: Negative.  Negative for blurred vision, double vision and photophobia.  Respiratory: Negative.  Negative for cough and shortness of breath.   Cardiovascular: Negative.  Negative for chest pain, palpitations and leg swelling.  Gastrointestinal: Positive for constipation. Negative for abdominal pain, heartburn, nausea and vomiting.       SEE HPI  Musculoskeletal: Positive for joint pain. Negative for myalgias.       SEE HPI  Neurological: Negative.  Negative for dizziness, focal weakness, seizures and headaches.  Psychiatric/Behavioral: Negative.  Negative for suicidal ideas.    Past Medical History:  Diagnosis Date  . Crohn's disease (HCC)   . Depression   . Irritable bowel syndrome (IBS)     Past Surgical History:  Procedure Laterality Date  . APPENDECTOMY      Family History  Problem Relation Age of Onset  . Heart disease Father   . Heart disease Maternal Grandmother     Social History Reviewed with no changes to be made today.   Outpatient Medications Prior to Visit  Medication Sig Dispense Refill  . diclofenac sodium (VOLTAREN) 1 % GEL Apply 4 g topically 4 (four) times daily. 100 g 1  . dicyclomine (BENTYL) 20 MG tablet Take  1 tablet (20 mg total) by mouth 4 (four) times daily. 120 tablet 1  . famotidine (PEPCID) 40 MG tablet Take 1 tablet (40 mg total) by mouth daily. 30 tablet 3  . methocarbamol (ROBAXIN-750) 750 MG tablet Take 1 tablet (750 mg total) by mouth 4 (four) times daily. 90 tablet 1  . Aspirin-Acetaminophen-Caffeine (GOODY HEADACHE PO) Take 1 packet by mouth daily as needed  (PAIN).     No facility-administered medications prior to visit.     Allergies  Allergen Reactions  . Amoxicillin     Has patient had a PCN reaction causing immediate rash, facial/tongue/throat swelling, SOB or lightheadedness with hypotension: Yes Has patient had a PCN reaction causing severe rash involving mucus membranes or skin necrosis: No Has patient had a PCN reaction that required hospitalization: No Has patient had a PCN reaction occurring within the last 10 years: No If all of the above answers are "NO", then may proceed with Cephalosporin use.   . Azithromycin   . Penicillins     Has patient had a PCN reaction causing immediate rash, facial/tongue/throat swelling, SOB or lightheadedness with hypotension: Yes Has patient had a PCN reaction causing severe rash involving mucus membranes or skin necrosis: No Has patient had a PCN reaction that required hospitalization:No Has patient had a PCN reaction occurring within the last 10 years: No If all of the above answers are "NO", then may proceed with Cephalosporin use.   . Trazodone And Nefazodone Other (See Comments)    Prolonged erection       Objective:    BP 97/65 (BP Location: Left Arm, Patient Position: Sitting, Cuff Size: Normal)   Pulse 65   Ht 5\' 8"  (1.727 m)   Wt 128 lb 3.2 oz (58.2 kg)   SpO2 98%   BMI 19.49 kg/m  Wt Readings from Last 3 Encounters:  11/26/17 128 lb 3.2 oz (58.2 kg)  11/02/17 133 lb 3.2 oz (60.4 kg)  09/22/17 130 lb 12.8 oz (59.3 kg)    Physical Exam  Constitutional: He is oriented to person, place, and time. He appears well-developed and well-nourished. He is cooperative.  HENT:  Head: Normocephalic and atraumatic.  Eyes: EOM are normal.  Neck: Normal range of motion.  Cardiovascular: Normal rate, regular rhythm, normal heart sounds and intact distal pulses. Exam reveals no gallop and no friction rub.  No murmur heard. Pulmonary/Chest: Effort normal and breath sounds normal. No  tachypnea. No respiratory distress. He has no decreased breath sounds. He has no wheezes. He has no rhonchi. He has no rales. He exhibits no tenderness.  Abdominal: Soft. Bowel sounds are normal. He exhibits no distension and no mass. There is no tenderness. There is no rebound and no guarding. No hernia.  Musculoskeletal: He exhibits no edema.       Right shoulder: He exhibits decreased range of motion, tenderness, swelling and deformity (at the acromioclavicular joint).  He is unable to raise his right arm past the midaxillary line  Neurological: He is alert and oriented to person, place, and time. Coordination normal.  Skin: Skin is warm and dry.  Psychiatric: He has a normal mood and affect. His behavior is normal. Judgment and thought content normal.  Nursing note and vitals reviewed.        Patient has been counseled extensively about nutrition and exercise as well as the importance of adherence with medications and regular follow-up. The patient was given clear instructions to go to ER or return to medical center  if symptoms don't improve, worsen or new problems develop. The patient verbalized understanding.   Follow-up: Return if symptoms worsen or fail to improve.   Claiborne Rigg, FNP-BC [facility] [place], [place] [phone]   11/26/2017, 11:31 AM

## 2017-11-27 ENCOUNTER — Other Ambulatory Visit: Payer: Self-pay

## 2017-11-27 ENCOUNTER — Ambulatory Visit (INDEPENDENT_AMBULATORY_CARE_PROVIDER_SITE_OTHER): Payer: Self-pay | Admitting: Physician Assistant

## 2017-11-27 ENCOUNTER — Encounter: Payer: Self-pay | Admitting: Physician Assistant

## 2017-11-27 VITALS — BP 101/66 | HR 81 | Temp 97.5°F | Resp 18 | Ht 68.0 in | Wt 130.4 lb

## 2017-11-27 DIAGNOSIS — M25511 Pain in right shoulder: Secondary | ICD-10-CM

## 2017-11-27 NOTE — Progress Notes (Signed)
    11/27/2017 12:18 PM   DOB: [birth year] / MRN: [id number]  SUBJECTIVE:  Ross Schwartz is a 52 y.o. male presenting for shoulder pain without trauma.  Abduction makes the pain worse.   He is allergic to amoxicillin; azithromycin; penicillins; and trazodone and nefazodone.   The problem list and medications were reviewed and updated by myself where necessary and exist elsewhere in the encounter.   OBJECTIVE:  BP 101/66   Pulse 81   Temp (!) 97.5 F (36.4 C) (Oral)   Resp 18   Ht 5\' 8"  (1.727 m)   Wt 130 lb 6.4 oz (59.1 kg)   SpO2 100%   BMI 19.83 kg/m    Physical Exam  Constitutional: He is oriented to person, place, and time. He appears well-developed. He does not appear ill.  Eyes: Pupils are equal, round, and reactive to light. Conjunctivae and EOM are normal.  Cardiovascular: Normal rate.  Pulmonary/Chest: Effort normal.  Abdominal: He exhibits no distension.  Musculoskeletal: Normal range of motion.       Arms: Neurological: He is alert and oriented to person, place, and time. No cranial nerve deficit. Coordination normal.  Skin: Skin is warm and dry. He is not diaphoretic.  Psychiatric: He has a normal mood and affect.  Nursing note and vitals reviewed.    ASSESSMENT AND PLAN:  Ross Schwartz was seen today for shoulder pain.  Diagnoses and all orders for this visit:  Acute pain of right shoulder: No sudden trauma.  AC joint inflammation. Possibly driven by arthritis. Patient hoping to avoid xrays if possible.  Advised he continue rest, ICE, diclofenac gel and add tylenol. RTC if needed.  -     DG Shoulder Right; Future    The patient is advised to call or return to clinic if he does not see an improvement in symptoms, or to seek the care of the closest emergency department if he worsens with the above plan.   Deliah Boston, MHS, PA-C [facility] 11/27/2017 12:18 PM

## 2017-11-27 NOTE — Patient Instructions (Addendum)
Take 1000 mg of tylenol every eight hours as needed in addition to your diclofenac gel. Wear your sling and try to work left handed at work.  Come back if you need to.     IF you received an x-ray today, you will receive an invoice from [facility]. Please contact [facility] at [phone] with questions or concerns regarding your invoice.   IF you received labwork today, you will receive an invoice from [facility]. Please contact [facility] at [phone] with questions or concerns regarding your invoice.   Our billing staff will not be able to assist you with questions regarding bills from these companies.  You will be contacted with the lab results as soon as they are available. The fastest way to get your results is to activate your My Chart account. Instructions are located on the last page of this paperwork. If you have not heard from [place] regarding the results in 2 weeks, please contact this office.

## 2018-02-02 ENCOUNTER — Encounter: Payer: Self-pay | Admitting: Nurse Practitioner

## 2019-04-06 IMAGING — CT CT ABD-PELV W/ CM
2 of 5 series · 15 of 46 positions shown, 17 images · IV contrast (ISOVUE)
Comparison: No priors.

CLINICAL DATA: 51-year-old male with history of irritable bowel
syndrome in Crohn's disease presenting with abdominal pain, cramping
and rectal bleeding for the past several months.

EXAM:
CT ABDOMEN AND PELVIS WITH CONTRAST
TECHNIQUE: Multidetector CT imaging of the abdomen and pelvis was performed
using the standard protocol following bolus administration of
intravenous contrast.
CONTRAST:  100mL 2NIVV2-S66 IOPAMIDOL (2NIVV2-S66) INJECTION 61%,
30mL 2NIVV2-S66 IOPAMIDOL (2NIVV2-S66) INJECTION 61%

[Series 2: axial st · axial · 0.74mm/px · z∈[+1055,+1425]mm · 12 of 86 slices shown, 14 images]
[im 6/86  soft-tissue]
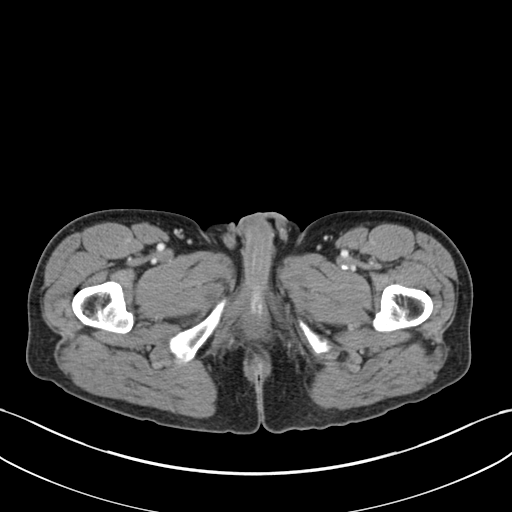
[im 6/86  bone]
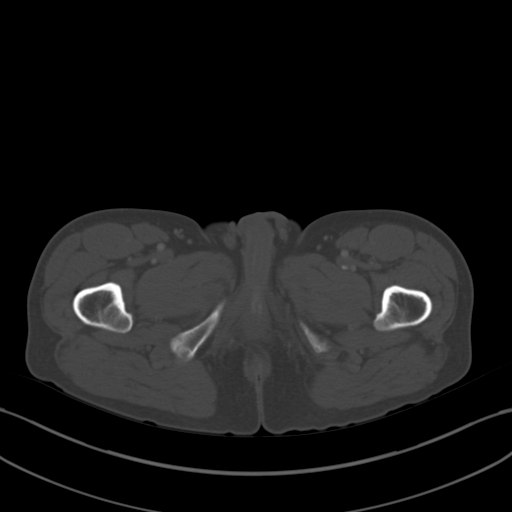
[im 11/86  soft-tissue]
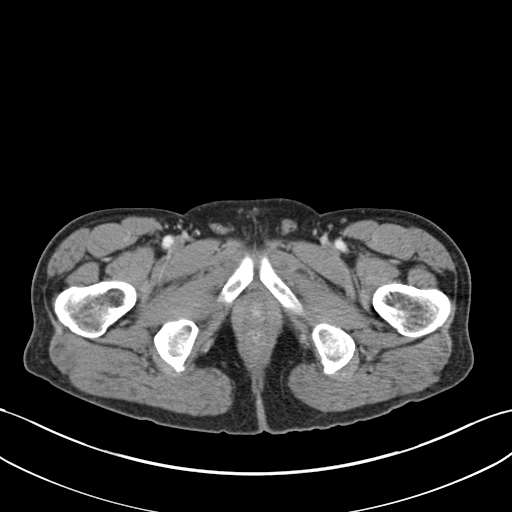
[im 22/86  soft-tissue]
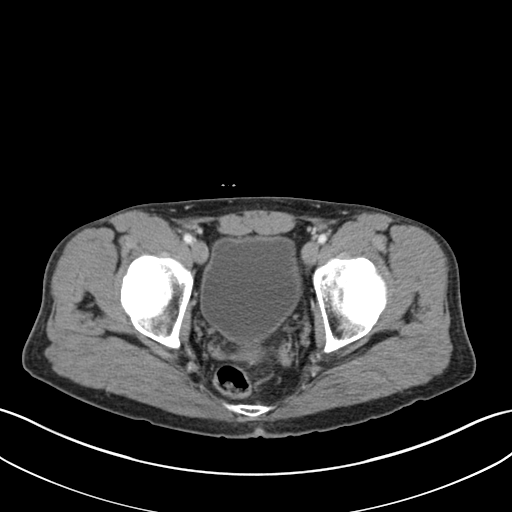
[im 27/86  soft-tissue]
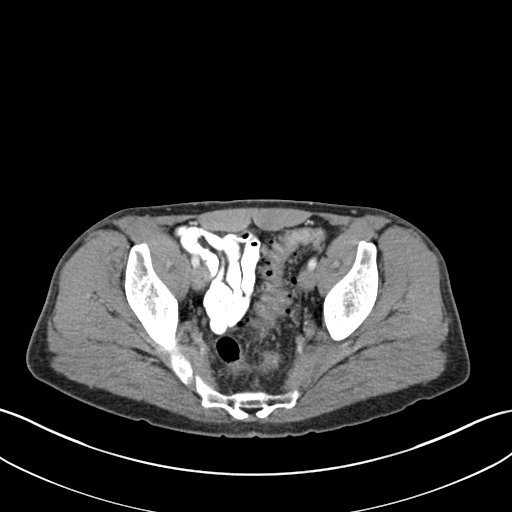
[im 32/86  soft-tissue]
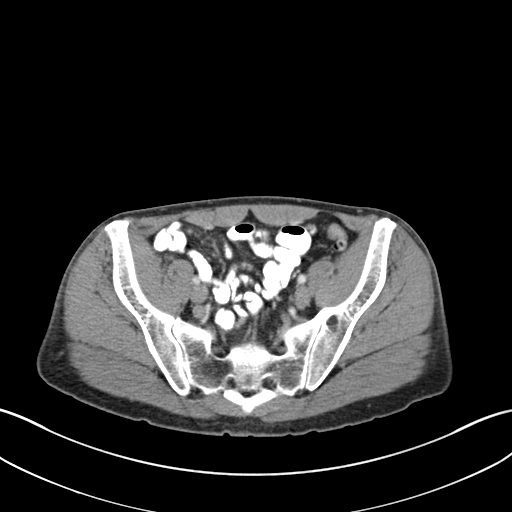
[im 38/86  soft-tissue]
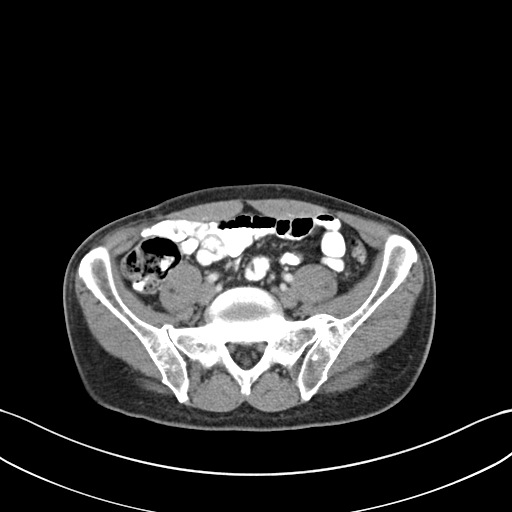
[im 48/86  soft-tissue]
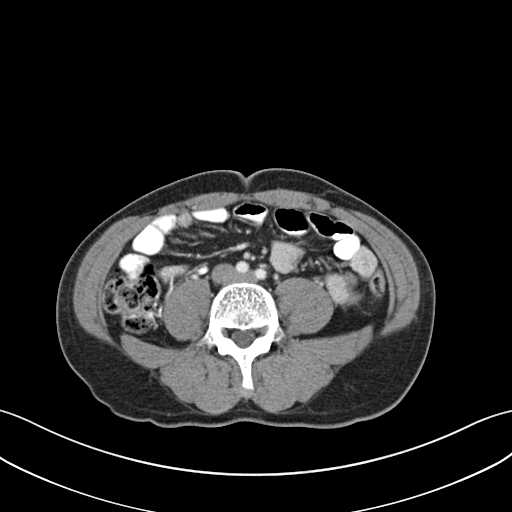
[im 54/86  soft-tissue]
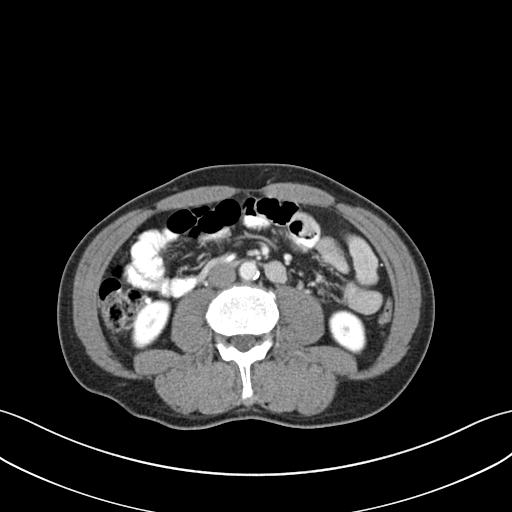
[im 59/86  soft-tissue]
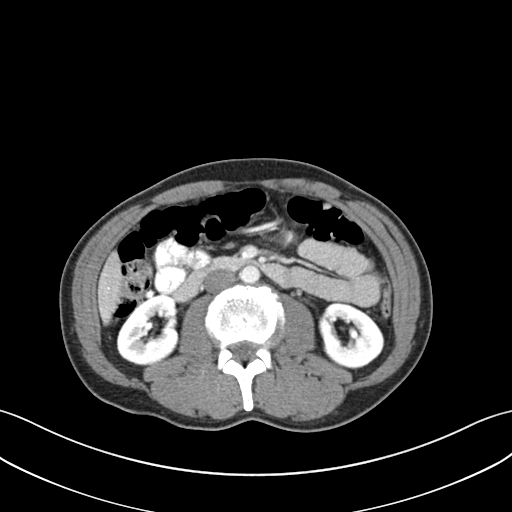
[im 59/86  bone]
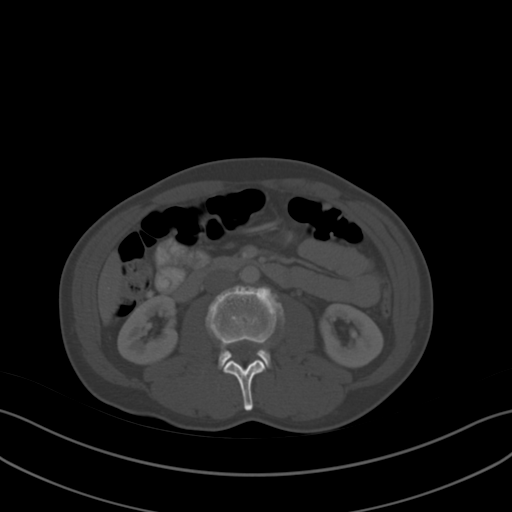
[im 64/86  soft-tissue]
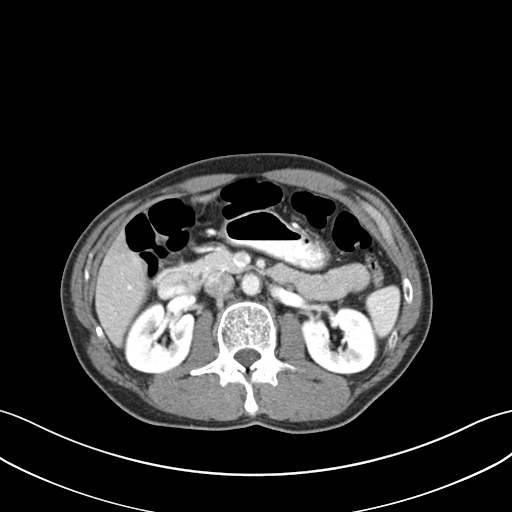
[im 75/86  soft-tissue]
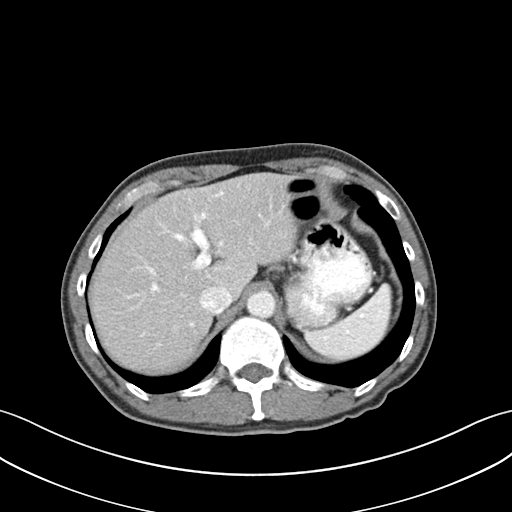
[im 80/86  soft-tissue]
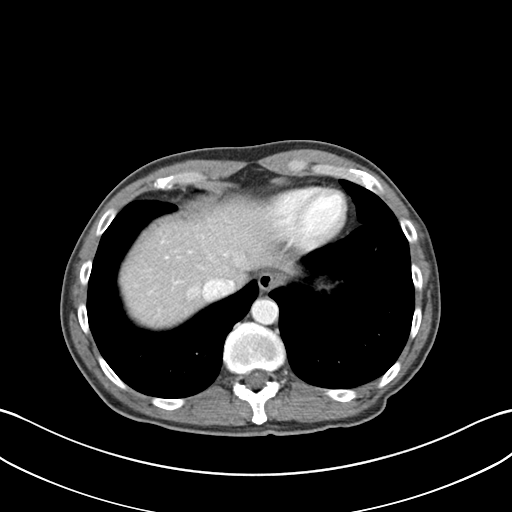

[Series 5: coronal st · coronal · 0.63mm/px · 3 of 77 slices shown]
[im 26/77  soft-tissue]
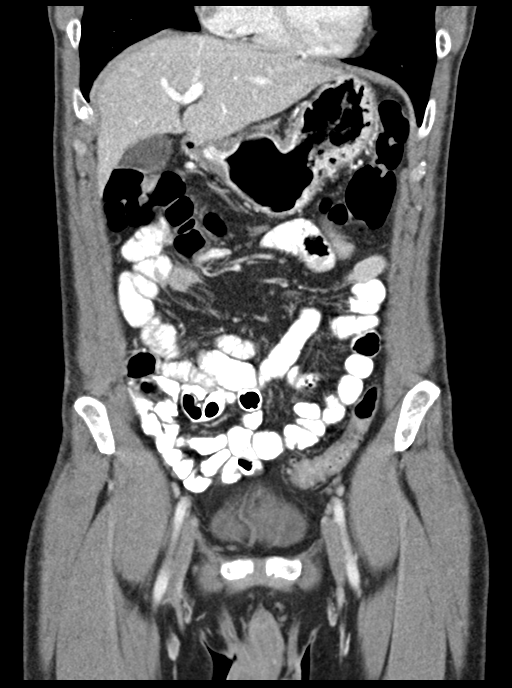
[im 34/77  soft-tissue]
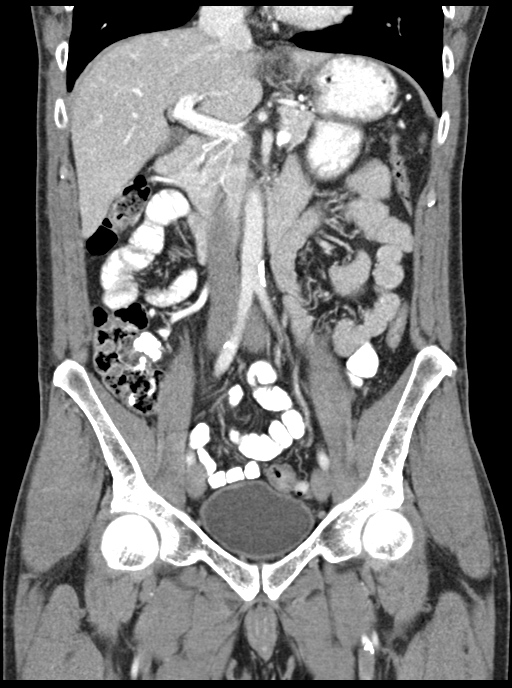
[im 43/77  soft-tissue]
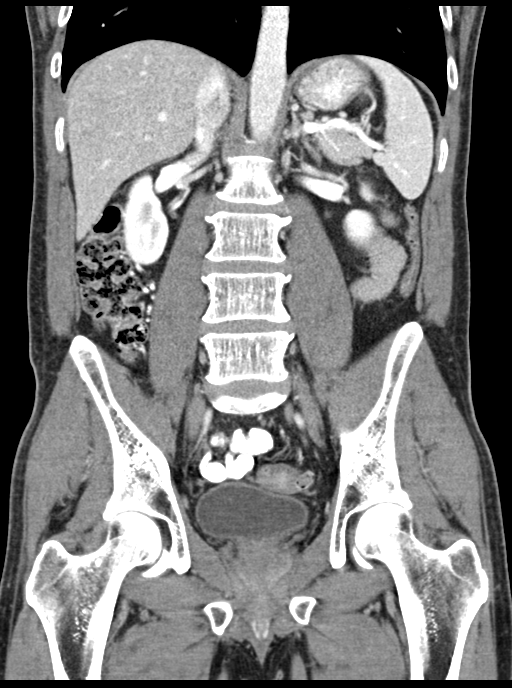

[15 of 46 positions shown; findings below may reference images not displayed]

FINDINGS: Lower chest: Mild scarring in the right middle lobe.

Hepatobiliary: No suspicious cystic or solid hepatic lesions. No
intra or extrahepatic biliary ductal dilatation. Gallbladder is
normal in appearance.

Pancreas: No pancreatic mass. No pancreatic ductal dilatation. No
pancreatic or peripancreatic fluid or inflammatory changes.

Spleen: Unremarkable.

Adrenals/Urinary Tract: Bilateral kidneys and bilateral adrenal
glands are normal in appearance. No hydroureteronephrosis. Urinary
bladder is normal in appearance.

Stomach/Bowel: Normal appearance of the stomach. No pathologic
dilatation of small bowel or colon. Numerous colonic diverticulae
are noted, without surrounding inflammatory changes to suggest an
acute diverticulitis at this time. No focal areas of mural
thickening or surrounding inflammatory changes are noted in the
small bowel or colon. Terminal ileum is grossly unremarkable in
appearance. The appendix is not confidently identified and may be
surgically absent. Regardless, there are no inflammatory changes
noted adjacent to the cecum to suggest the presence of an acute
appendicitis at this time.

Vascular/Lymphatic: Aortic atherosclerosis, without evidence of
aneurysm or dissection in the abdominal or pelvic vasculature. No
lymphadenopathy noted in the abdomen or pelvis.

Reproductive: Prostate gland and seminal vesicles are unremarkable
in appearance.

Other: No significant volume of ascites.  No pneumoperitoneum.

Musculoskeletal: There are no aggressive appearing lytic or blastic
lesions noted in the visualized portions of the skeleton.
IMPRESSION: 1. No acute findings are noted in the abdomen or pelvis to account
for the patient's symptoms. Specifically, no definite findings to
suggest active Crohn's disease.
2. Colonic diverticulosis without evidence of acute diverticulitis
at this time.
3. Aortic atherosclerosis.

Aortic Atherosclerosis ([id number].C).

## 2021-05-19 ENCOUNTER — Encounter (HOSPITAL_COMMUNITY): Payer: Self-pay | Admitting: Emergency Medicine

## 2021-05-19 ENCOUNTER — Emergency Department (HOSPITAL_COMMUNITY)
Admission: EM | Admit: 2021-05-19 | Discharge: 2021-05-19 | Disposition: A | Discharge status: Home / Self Care | Payer: No Typology Code available for payment source | Attending: Emergency Medicine | Admitting: Emergency Medicine

## 2021-05-19 ENCOUNTER — Other Ambulatory Visit: Payer: Self-pay

## 2021-05-19 ENCOUNTER — Emergency Department (HOSPITAL_COMMUNITY): Payer: No Typology Code available for payment source

## 2021-05-19 DIAGNOSIS — M542 Cervicalgia: Secondary | ICD-10-CM

## 2021-05-19 DIAGNOSIS — T148XXA Other injury of unspecified body region, initial encounter: Secondary | ICD-10-CM

## 2021-05-19 DIAGNOSIS — F17200 Nicotine dependence, unspecified, uncomplicated: Secondary | ICD-10-CM | POA: Insufficient documentation

## 2021-05-19 DIAGNOSIS — M5442 Lumbago with sciatica, left side: Secondary | ICD-10-CM

## 2021-05-19 DIAGNOSIS — Y92410 Unspecified street and highway as the place of occurrence of the external cause: Secondary | ICD-10-CM | POA: Diagnosis not present

## 2021-05-19 DIAGNOSIS — S0083XA Contusion of other part of head, initial encounter: Secondary | ICD-10-CM | POA: Insufficient documentation

## 2021-05-19 DIAGNOSIS — S0990XA Unspecified injury of head, initial encounter: Secondary | ICD-10-CM | POA: Diagnosis present

## 2021-05-19 DIAGNOSIS — M545 Low back pain, unspecified: Secondary | ICD-10-CM | POA: Diagnosis not present

## 2021-05-19 MED ORDER — OXYCODONE HCL 5 MG PO TABS
5.0000 mg | ORAL_TABLET | Freq: Once | ORAL | Status: AC
Start: 2021-05-19 — End: 2021-05-19
  Administered 2021-05-19: 16:00:00 5 mg via ORAL
  Filled 2021-05-19: qty 1

## 2021-05-19 MED ORDER — KETOROLAC TROMETHAMINE 15 MG/ML IJ SOLN
15.0000 mg | Freq: Once | INTRAMUSCULAR | Status: AC
  Administered 2021-05-19: 16:00:00 15 mg via INTRAMUSCULAR
  Filled 2021-05-19: qty 1

## 2021-05-19 MED ORDER — ACETAMINOPHEN 500 MG PO TABS
1000.0000 mg | ORAL_TABLET | Freq: Once | ORAL | Status: AC
  Administered 2021-05-19: 16:00:00 1000 mg via ORAL
  Filled 2021-05-19: qty 2
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
## 2021-05-19 NOTE — Discharge Instructions (Signed)
Take 4 over the counter ibuprofen tablets 3 times a day or 2 over-the-counter naproxen tablets twice a day for pain. Also take tylenol 1000mg (2 extra strength) four times a day.  You will hurt worse tomorrow this is normal.  Please return for difficulty breathing if you start having terrible abdominal pain or if you develop worsening confusion or vomiting.
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
## 2021-05-20 ENCOUNTER — Ambulatory Visit: Payer: Self-pay | Admitting: *Deleted
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
## 2021-06-12 ENCOUNTER — Ambulatory Visit: Payer: Self-pay | Attending: Physician Assistant | Admitting: Physician Assistant
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
## 2021-07-03 ENCOUNTER — Ambulatory Visit: Payer: No Typology Code available for payment source | Attending: Physician Assistant | Admitting: Physician Assistant

## 2021-07-03 ENCOUNTER — Encounter: Payer: Self-pay | Admitting: Physician Assistant

## 2021-07-03 ENCOUNTER — Other Ambulatory Visit: Payer: Self-pay

## 2021-07-03 VITALS — BP 113/74 | HR 65 | Ht 68.0 in | Wt 137.4 lb

## 2021-07-03 DIAGNOSIS — Z09 Encounter for follow-up examination after completed treatment for conditions other than malignant neoplasm: Secondary | ICD-10-CM

## 2021-07-03 DIAGNOSIS — D696 Thrombocytopenia, unspecified: Secondary | ICD-10-CM

## 2021-07-03 DIAGNOSIS — K588 Other irritable bowel syndrome: Secondary | ICD-10-CM

## 2023-06-15 DIAGNOSIS — Z1211 Encounter for screening for malignant neoplasm of colon: Secondary | ICD-10-CM | POA: Diagnosis not present

## 2023-06-15 DIAGNOSIS — Z23 Encounter for immunization: Secondary | ICD-10-CM | POA: Diagnosis not present

## 2023-06-15 DIAGNOSIS — K50919 Crohn's disease, unspecified, with unspecified complications: Secondary | ICD-10-CM | POA: Diagnosis not present

## 2023-06-15 DIAGNOSIS — Z131 Encounter for screening for diabetes mellitus: Secondary | ICD-10-CM | POA: Diagnosis not present

## 2023-06-15 DIAGNOSIS — Z0000 Encounter for general adult medical examination without abnormal findings: Secondary | ICD-10-CM | POA: Diagnosis not present

## 2023-06-15 DIAGNOSIS — M549 Dorsalgia, unspecified: Secondary | ICD-10-CM | POA: Diagnosis not present

## 2023-06-15 DIAGNOSIS — Z125 Encounter for screening for malignant neoplasm of prostate: Secondary | ICD-10-CM | POA: Diagnosis not present

## 2023-06-15 DIAGNOSIS — Z13220 Encounter for screening for lipoid disorders: Secondary | ICD-10-CM | POA: Diagnosis not present

## 2023-06-15 DIAGNOSIS — H9209 Otalgia, unspecified ear: Secondary | ICD-10-CM | POA: Diagnosis not present

## 2023-06-24 ENCOUNTER — Encounter: Payer: Self-pay | Admitting: Gastroenterology

## 2023-07-01 DIAGNOSIS — Z1211 Encounter for screening for malignant neoplasm of colon: Secondary | ICD-10-CM | POA: Diagnosis not present

## 2023-07-05 DIAGNOSIS — M791 Myalgia, unspecified site: Secondary | ICD-10-CM | POA: Diagnosis not present

## 2023-07-05 DIAGNOSIS — M47896 Other spondylosis, lumbar region: Secondary | ICD-10-CM | POA: Diagnosis not present

## 2023-08-09 ENCOUNTER — Telehealth (INDEPENDENT_AMBULATORY_CARE_PROVIDER_SITE_OTHER): Payer: Self-pay | Admitting: Otolaryngology

## 2023-08-09 NOTE — Telephone Encounter (Signed)
 LVM to confirm appt & location [id number]

## 2023-08-10 ENCOUNTER — Encounter (INDEPENDENT_AMBULATORY_CARE_PROVIDER_SITE_OTHER): Payer: Self-pay

## 2023-08-10 ENCOUNTER — Ambulatory Visit (INDEPENDENT_AMBULATORY_CARE_PROVIDER_SITE_OTHER): Payer: Self-pay | Admitting: Otolaryngology

## 2023-08-10 VITALS — BP 112/76 | HR 91 | Ht 68.0 in | Wt 143.0 lb

## 2023-08-10 DIAGNOSIS — F17200 Nicotine dependence, unspecified, uncomplicated: Secondary | ICD-10-CM

## 2023-08-10 DIAGNOSIS — H6993 Unspecified Eustachian tube disorder, bilateral: Secondary | ICD-10-CM

## 2023-08-10 DIAGNOSIS — H938X3 Other specified disorders of ear, bilateral: Secondary | ICD-10-CM

## 2023-08-10 DIAGNOSIS — H9190 Unspecified hearing loss, unspecified ear: Secondary | ICD-10-CM

## 2023-08-10 DIAGNOSIS — F17210 Nicotine dependence, cigarettes, uncomplicated: Secondary | ICD-10-CM | POA: Diagnosis not present

## 2023-08-10 MED ORDER — PREDNISONE 10 MG PO TABS: 30.0000 mg | ORAL_TABLET | Freq: Every day | ORAL | 0 refills | Status: AC

## 2023-08-10 MED ORDER — FLUTICASONE PROPIONATE 50 MCG/ACT NA SUSP: 2.0000 | Freq: Two times a day (BID) | NASAL | 6 refills | Status: AC

## 2023-08-10 NOTE — Progress Notes (Signed)
 Dear Dr. Judithann Sheen, Here is my assessment for our mutual patient, Ross Schwartz. Thank you for allowing me the opportunity to care for your patient. Please do not hesitate to contact me should you have any other questions. Sincerely, Dr. Jovita Kussmaul  Otolaryngology Clinic Note Referring provider: Dr. Judithann Sheen HPI:  Ross Schwartz is a 58 y.o. male kindly referred by Dr. Judithann Sheen for evaluation of ear fullness and hearing loss.  Initial visit (07/2023): Patient reports: ongoing bilateral ear fullness for several years, and he reports that he had a URI about 9 months ago, and it never seemed to have gone away. He had some ear pain at that point, but that has resolved. Hearing is muffled, feels "as if I'm driving through the mountains." When he pops his ears, he feels like he is back to normal. He has some nasal congestion, and some anterior rhinorrhea but no facial pain/pressure, discolored drainage, hyposmia, or other CRS sx. Nose overall is doing well. No nasal spray use.  Patient denies: NO ear pain, vertigo, drainage, tinnitus Patient additionally denies: deep pain in ear canal, eustachian tube symptoms such as popping/crackling, sensitive to pressure changes Patient also denies barotrauma, vestibular suppressant use, ototoxic medication use Prior ear surgery: no Does not get frequent ear infections.  No hearing test recently.   H&N Surgery: no Personal or FHx of bleeding dz or anesthesia difficulty: no  PMHx: Crohn's, IBS, GERD, Back Pain  AP/AC: no  Tobacco: smokes 1 PPD (30 pack year). Lives in [place], [place]  Independent Review of Additional Tests or Records:  CT S-spine (05/19/2021): reviewed and interpreted independently with respect to ears - mastoids and ME well aerated, no noted otic capsule or ossicular chain abnormality Dr. Judithann Sheen (PCP) notes reviewed and uploaded or available in chart in media tab (06/15/2023): noted bilateral otalgia, would like to see specialist; Dx: otalgia, Rx: ref  ENT CMP reviewed 06/15/2023: Bun/Cr 10/1.01  PMH/Meds/All/SocHx/FamHx/ROS:   Past Medical History:  Diagnosis Date   Crohn's disease (HCC)    Depression    Irritable bowel syndrome (IBS)      Past Surgical History:  Procedure Laterality Date   APPENDECTOMY      Family History  Problem Relation Age of Onset   Heart disease Father    Heart disease Maternal Grandmother      Social Connections: Not on file      Current Outpatient Medications:    Aspirin-Acetaminophen-Caffeine (GOODY HEADACHE PO), Take 1 packet by mouth daily as needed (PAIN)., Disp: , Rfl:    cyclobenzaprine (FLEXERIL) 5 MG tablet, Take 5 mg by mouth at bedtime as needed., Disp: , Rfl:    diclofenac sodium (VOLTAREN) 1 % GEL, Apply 4 g topically 4 (four) times daily., Disp: 100 g, Rfl: 1   methocarbamol (ROBAXIN) 750 MG tablet, methocarbamol 750 mg tablet  Take 1 tablet 3 times a day by oral route as needed., Disp: , Rfl:    predniSONE (DELTASONE) 10 MG tablet, Take 3 tablets (30 mg total) by mouth daily with breakfast for 5 days., Disp: 15 tablet, Rfl: 0   dicyclomine (BENTYL) 20 MG tablet, Take 1 tablet (20 mg total) by mouth 4 (four) times daily. (Patient not taking: Reported on 08/10/2023), Disp: 120 tablet, Rfl: 1   famotidine (PEPCID) 40 MG tablet, Take 1 tablet (40 mg total) by mouth daily. (Patient not taking: Reported on 08/10/2023), Disp: 30 tablet, Rfl: 3   Physical Exam:   BP 112/76 (BP Location: Left Arm, Patient Position: Sitting, Cuff Size:  Normal)   Pulse 91   Ht 5\' 8"  (1.727 m)   Wt 143 lb (64.9 kg)   SpO2 96%   BMI 21.74 kg/m   Salient findings:  CN II-XII intact Given history and complaints, ear microscopy was indicated and performed for evaluation with findings as below in physical exam section and in procedures; Bilateral EAC clear and TM intact with well pneumatized middle ear spaces, anterior mild retraction, on pneumatic insufflation and him popping his ear, posterior portion of TM  moves, not anterior Weber 512: mid Rinne 512: AC > BC b/l  Anterior rhinoscopy: Septum relatively midline; bilateral inferior turbinates without significant hypertrophy No lesions of oral cavity/oropharynx; edentulous No obviously palpable neck masses/lymphadenopathy/thyromegaly No respiratory distress or stridor  Seprately Identifiable Procedures:  Procedure: Bilateral ear microscopy using microscope (CPT 92504) Pre-procedure diagnosis: hearing loss, eustachian tube dysfunction (bilateral), bilateral ear fullness Post-procedure diagnosis: same Indication:; given patient's otologic complaints and history, for improved and comprehensive examination of external ear and tympanic membrane, bilateral otologic examination using microscope was performed  Procedure: Patient was placed semi-recumbent. Both ear canals were examined using the microscope with findings above. Patient tolerated the procedure well.   Impression & Plans:  Ross Schwartz is a 58 y.o. male with:  1. Dysfunction of both eustachian tubes   2. Sensation of fullness in both ears   3. Subjective hearing loss   4. Tobacco use disorder    Noted ear fullness and muffled hearing since he had a URI 9 months ago. No effusion or ear pain. Sx and exam seem consistent with ETD.  We discussed Rx options including medical management with nasal sprays, PO steroids, and BTT He opted for medical management first.  Given the patient's tobacco use, I also discussed cessation and options for cessation, including counseling. Counseled patient on the dangers of tobacco use, advised patient to stop smoking, and reviewed strategies to maximize success. Patient is not ready to quit, and declined further treatment. Total time spent with this was 3 minutes.    - Pred burst 30mg  x5d - Autoinsufflate ears - Flonase BID x6 weeks - f/u 8 weeks with audiogram  See below regarding exact medications prescribed this encounter including dosages and  route: Meds ordered this encounter  Medications   predniSONE (DELTASONE) 10 MG tablet    Sig: Take 3 tablets (30 mg total) by mouth daily with breakfast for 5 days.    Dispense:  15 tablet    Refill:  0      Thank you for allowing me the opportunity to care for your patient. Please do not hesitate to contact me should you have any other questions.  Sincerely, Jovita Kussmaul, MD Otolaryngologist (ENT), [facility] Phone: [phone] Fax: [phone]  08/10/2023, 3:20 PM   MDM:  Level 4 Complexity/Problems addressed: mod Data complexity: mod - independent review of notes, labs; independent interpretation of CT imaging - Morbidity: mod  - Prescription Drug prescribed or managed: yes

## 2023-08-10 NOTE — Patient Instructions (Addendum)
 Prednisone 30mg  (3 tablets all at once) in the morning with breakfast for 5 days Use flonase two sprays each nostril twice per day

## 2023-08-16 ENCOUNTER — Encounter: Payer: Self-pay | Admitting: Gastroenterology

## 2023-08-16 ENCOUNTER — Ambulatory Visit: Payer: Self-pay | Admitting: Gastroenterology

## 2023-08-16 ENCOUNTER — Other Ambulatory Visit (INDEPENDENT_AMBULATORY_CARE_PROVIDER_SITE_OTHER)

## 2023-08-16 VITALS — BP 120/70 | HR 87 | Ht 68.0 in | Wt 143.0 lb

## 2023-08-16 DIAGNOSIS — K625 Hemorrhage of anus and rectum: Secondary | ICD-10-CM

## 2023-08-16 DIAGNOSIS — K50011 Crohn's disease of small intestine with rectal bleeding: Secondary | ICD-10-CM

## 2023-08-16 DIAGNOSIS — R195 Other fecal abnormalities: Secondary | ICD-10-CM | POA: Diagnosis not present

## 2023-08-16 LAB — BASIC METABOLIC PANEL
BUN: 11 mg/dL (ref 6–23)
CO2: 27 meq/L (ref 19–32)
Calcium: 9 mg/dL (ref 8.4–10.5)
Chloride: 95 meq/L — ABNORMAL LOW (ref 96–112)
Creatinine, Ser: 0.96 mg/dL (ref 0.40–1.50)
GFR: 87.14 mL/min (ref >60.00)
Glucose, Bld: 82 mg/dL (ref 70–99)
Potassium: 3.7 meq/L (ref 3.5–5.1)
Sodium: 131 meq/L — ABNORMAL LOW (ref 135–145)

## 2023-08-16 LAB — CBC WITH DIFFERENTIAL/PLATELET
Basophils Absolute: 0.1 10*3/uL (ref 0.0–0.1)
Basophils Relative: 0.5 % (ref 0.0–3.0)
Eosinophils Absolute: 0 10*3/uL (ref 0.0–0.7)
Eosinophils Relative: 0.3 % (ref 0.0–5.0)
HCT: 45.6 % (ref 39.0–52.0)
Hemoglobin: 15.8 g/dL (ref 13.0–17.0)
Lymphocytes Relative: 26.2 % (ref 12.0–46.0)
Lymphs Abs: 2.8 10*3/uL (ref 0.7–4.0)
MCHC: 34.6 g/dL (ref 30.0–36.0)
MCV: 96.4 fl (ref 78.0–100.0)
Monocytes Absolute: 1.3 10*3/uL — ABNORMAL HIGH (ref 0.1–1.0)
Monocytes Relative: 12.3 % — ABNORMAL HIGH (ref 3.0–12.0)
Neutro Abs: 6.6 10*3/uL (ref 1.4–7.7)
Neutrophils Relative %: 60.7 % (ref 43.0–77.0)
Platelets: 192 10*3/uL (ref 150.0–400.0)
RBC: 4.74 Mil/uL (ref 4.22–5.81)
RDW: 13.1 % (ref 11.5–15.5)
WBC: 10.9 10*3/uL — ABNORMAL HIGH (ref 4.0–10.5)

## 2023-08-16 LAB — C-REACTIVE PROTEIN: CRP: <1 mg/dL (ref 0.5–20.0)

## 2023-08-16 LAB — SEDIMENTATION RATE: Sed Rate: 9 mm/h (ref 0–20)

## 2023-08-16 MED ORDER — SUFLAVE 178.7 G PO SOLR: 1.0000 | Freq: Once | ORAL | 0 refills | Status: AC

## 2023-08-16 NOTE — Patient Instructions (Addendum)
 We have sent the following medications to your pharmacy for you to pick up at your convenience: OTC Miralax 1/2-1 full dose po daily, titrate as needed.  Can take up to two full doses daily if needed.  Recommend yearly eye exam with ophthalmology Recommend yearly skin exam with dermatologist Recommend staying up to date on vaccinations.  Your provider has requested that you go to the basement level for lab work before leaving today. Press "B" on the elevator. The lab is located at the first door on the left as you exit the elevator.   Due to recent changes in healthcare laws, you may see the results of your imaging and laboratory studies on MyChart before your provider has had a chance to review them.  We understand that in some cases there may be results that are confusing or concerning to you. Not all laboratory results come back in the same time frame and the provider may be waiting for multiple results in order to interpret others.  Please give [place] 48 hours in order for your provider to thoroughly review all the results before contacting the office for clarification of your results.    You have been scheduled for a colonoscopy. Please follow written instructions given to you at your visit today.   If you use inhalers (even only as needed), please bring them with you on the day of your procedure.  DO NOT TAKE 7 DAYS PRIOR TO TEST- Trulicity (dulaglutide) Ozempic, Wegovy (semaglutide) Mounjaro (tirzepatide) Bydureon Bcise (exanatide extended release)  DO NOT TAKE 1 DAY PRIOR TO YOUR TEST Rybelsus (semaglutide) Adlyxin (lixisenatide) Victoza (liraglutide) Byetta (exanatide) ___________________________________________________________________________  Bonita Quin will receive your bowel preparation through [facility], which ensures the lowest copay and home delivery, with outreach via text or call from an [phone] number. Please respond promptly to avoid rescheduling of your procedure. If you are  interested in alternative options or have any questions regarding your prep, please contact them at [phone] ____________________________________________________________________________  Your Provider Has Sent Your Bowel Prep Regimen To [facility]   [facility] will contact you to verify your information and collect your copay, if applicable. Enjoy the comfort of your home while your prescription is mailed to you, FREE of any shipping charges.   [facility] accepts all major insurance benefits and applies discounts & coupons.  Have additional questions?   Chat: [email or web address] Call: [phone] Email: [email or web address] NCPDP: [id number]  How will [facility] contact you?  With a Welcome phone call,  a Welcome text and a checkout link in text form.  Texts you receive from [phone] Are NOT Spam.  *To set up delivery, you must complete the checkout process via link or speak to one of the patient care representatives. If [facility] is unable to reach you, your prescription may be delayed.  To avoid long hold times on the phone, you may also utilize the secure chat feature on the [facility] website to request that they call you back for transaction completion or to expedite your concerns.   Thank you for trusting me with your gastrointestinal care!   Deanna May, NP    _______________________________________________________  If your blood pressure at your visit was 140/90 or greater, please contact your primary care physician to follow up on this.  _______________________________________________________  If you are age 58 or older, your body mass index should be between 23-30. Your Body mass index is 21.74 kg/m. If this is out of the aforementioned range listed, please consider follow up with your Primary Care Provider.  If you are age 18 or younger, your body mass index should be between 19-25. Your Body mass index is 21.74 kg/m. If this is out of the  aformentioned range listed, please consider follow up with your Primary Care Provider.   ________________________________________________________  The [facility] providers would like to encourage you to use [facility] to communicate with providers for non-urgent requests or questions.  Due to long hold times on the telephone, sending your provider a message by [facility] may be a faster and more efficient way to get a response.  Please allow 48 business hours for a response.  Please remember that this is for non-urgent requests.  _______________________________________________________
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
## 2023-08-18 DIAGNOSIS — M47896 Other spondylosis, lumbar region: Secondary | ICD-10-CM | POA: Diagnosis not present

## 2023-08-25 DIAGNOSIS — M47896 Other spondylosis, lumbar region: Secondary | ICD-10-CM | POA: Diagnosis not present

## 2023-08-31 ENCOUNTER — Other Ambulatory Visit

## 2023-08-31 DIAGNOSIS — K50011 Crohn's disease of small intestine with rectal bleeding: Secondary | ICD-10-CM

## 2023-08-31 DIAGNOSIS — K625 Hemorrhage of anus and rectum: Secondary | ICD-10-CM

## 2023-09-02 DIAGNOSIS — M47896 Other spondylosis, lumbar region: Secondary | ICD-10-CM | POA: Diagnosis not present

## 2023-09-02 LAB — CALPROTECTIN, FECAL: Calprotectin, Fecal: 46 ug/g (ref 0–120)

## 2023-09-05 NOTE — Progress Notes (Signed)
 Agree with the assessment and plan as outlined by [facility], FNP-C.  Carlitos Bottino, DO, [facility]

## 2023-09-09 DIAGNOSIS — M47896 Other spondylosis, lumbar region: Secondary | ICD-10-CM | POA: Diagnosis not present

## 2023-09-16 DIAGNOSIS — M47896 Other spondylosis, lumbar region: Secondary | ICD-10-CM | POA: Diagnosis not present

## 2023-09-17 ENCOUNTER — Encounter: Payer: Self-pay | Admitting: Gastroenterology

## 2023-09-21 DIAGNOSIS — M47896 Other spondylosis, lumbar region: Secondary | ICD-10-CM | POA: Diagnosis not present

## 2023-09-24 ENCOUNTER — Encounter: Payer: Self-pay | Admitting: Gastroenterology

## 2023-09-24 ENCOUNTER — Ambulatory Visit (AMBULATORY_SURGERY_CENTER): Admitting: Gastroenterology

## 2023-09-24 VITALS — BP 130/54 | HR 70 | Temp 98.2°F | Resp 18 | Ht 68.0 in | Wt 143.0 lb

## 2023-09-24 DIAGNOSIS — K644 Residual hemorrhoidal skin tags: Secondary | ICD-10-CM | POA: Diagnosis not present

## 2023-09-24 DIAGNOSIS — K635 Polyp of colon: Secondary | ICD-10-CM | POA: Diagnosis not present

## 2023-09-24 DIAGNOSIS — F32A Depression, unspecified: Secondary | ICD-10-CM | POA: Diagnosis not present

## 2023-09-24 DIAGNOSIS — R195 Other fecal abnormalities: Secondary | ICD-10-CM | POA: Diagnosis not present

## 2023-09-24 DIAGNOSIS — K921 Melena: Secondary | ICD-10-CM | POA: Diagnosis not present

## 2023-09-24 DIAGNOSIS — K5730 Diverticulosis of large intestine without perforation or abscess without bleeding: Secondary | ICD-10-CM | POA: Diagnosis not present

## 2023-09-24 DIAGNOSIS — K50011 Crohn's disease of small intestine with rectal bleeding: Secondary | ICD-10-CM | POA: Diagnosis not present

## 2023-09-24 DIAGNOSIS — K641 Second degree hemorrhoids: Secondary | ICD-10-CM

## 2023-09-24 DIAGNOSIS — K625 Hemorrhage of anus and rectum: Secondary | ICD-10-CM

## 2023-09-24 DIAGNOSIS — F419 Anxiety disorder, unspecified: Secondary | ICD-10-CM | POA: Diagnosis not present

## 2023-09-24 DIAGNOSIS — D125 Benign neoplasm of sigmoid colon: Secondary | ICD-10-CM

## 2023-09-24 MED ORDER — SODIUM CHLORIDE 0.9 % IV SOLN: 500.0000 mL | Freq: Once | INTRAVENOUS | Status: DC

## 2023-09-24 NOTE — Progress Notes (Signed)
 Sedate, gd SR, tolerated procedure well, VSS, report to RN

## 2023-09-24 NOTE — Patient Instructions (Addendum)
 Resume previous diet Continue present medications Await pathology results Repeat colonoscopy for surveillance based on pathology results If continued symptomatic hemorrhoids, recommend referral to Colorectal Surgery for evaluation and intervention See handouts for polyps, diverticulosis and hemorrhoids YOU HAD AN ENDOSCOPIC PROCEDURE TODAY AT THE [facility]:   Refer to the procedure report that was given to you for any specific questions about what was found during the examination.  If the procedure report does not answer your questions, please call your gastroenterologist to clarify.  If you requested that your care partner not be given the details of your procedure findings, then the procedure report has been included in a sealed envelope for you to review at your convenience later.  YOU SHOULD EXPECT: Some feelings of bloating in the abdomen. Passage of more gas than usual.  Walking can help get rid of the air that was put into your GI tract during the procedure and reduce the bloating. If you had a lower endoscopy (such as a colonoscopy or flexible sigmoidoscopy) you may notice spotting of blood in your stool or on the toilet paper. If you underwent a bowel prep for your procedure, you may not have a normal bowel movement for a few days.  Please Note:  You might notice some irritation and congestion in your nose or some drainage.  This is from the oxygen used during your procedure.  There is no need for concern and it should clear up in a day or so.  SYMPTOMS TO REPORT IMMEDIATELY:  Following lower endoscopy (colonoscopy or flexible sigmoidoscopy):  Excessive amounts of blood in the stool  Significant tenderness or worsening of abdominal pains  Swelling of the abdomen that is new, acute  Fever of 100F or higher  For urgent or emergent issues, a gastroenterologist can be reached at any hour by calling [phone]. Do not use MyChart messaging for urgent concerns.   DIET:   We do recommend a small meal at first, but then you may proceed to your regular diet.  Drink plenty of fluids but you should avoid alcoholic beverages for 24 hours.  ACTIVITY:  You should plan to take it easy for the rest of today and you should NOT DRIVE or use heavy machinery until tomorrow (because of the sedation medicines used during the test).    FOLLOW UP: Our staff will call the number listed on your records the next business day following your procedure.  We will call around 7:15- 8:00 am to check on you and address any questions or concerns that you may have regarding the information given to you following your procedure. If we do not reach you, we will leave a message.     If any biopsies were taken you will be contacted by phone or by letter within the next 1-3 weeks.  Please call us  at [phone] if you have not heard about the biopsies in 3 weeks.   SIGNATURES/CONFIDENTIALITY: You and/or your care partner have signed paperwork which will be entered into your electronic medical record.  These signatures attest to the fact that that the information above on your After Visit Summary has been reviewed and is understood.  Full responsibility of the confidentiality of this discharge information lies with you and/or your care-partner.

## 2023-09-24 NOTE — Op Note (Signed)
 [facility] Patient Name: Ross Schwartz Procedure Date: 09/24/2023 3:57 PM MRN: [id number] Endoscopist: Harry Lindau , MD, [id number] Age: 58 Referring MD:  Date of Birth: 16-May-1966 Gender: Male Account #: [id number] Procedure:                Colonoscopy Indications:              Hematochezia, Positive fecal immunochemical test                           History of colon polyps Medicines:                Monitored Anesthesia Care Procedure:                Pre-Anesthesia Assessment:                           - Prior to the procedure, a History and Physical                            was performed, and patient medications and                            allergies were reviewed. The patient's tolerance of                            previous anesthesia was also reviewed. The risks                            and benefits of the procedure and the sedation                            options and risks were discussed with the patient.                            All questions were answered, and informed consent                            was obtained. Prior Anticoagulants: The patient has                            taken no anticoagulant or antiplatelet agents. ASA                            Grade Assessment: II - A patient with mild systemic                            disease. After reviewing the risks and benefits,                            the patient was deemed in satisfactory condition to                            undergo the procedure.  After obtaining informed consent, the colonoscope                            was passed under direct vision. Throughout the                            procedure, the patient's blood pressure, pulse, and                            oxygen saturations were monitored continuously. The                            CF [id number] #[id number] was introduced through the anus                            and advanced to the sigmoid colon,  which was                            non-traversable with the standard colonoscope and                            exchanged for an ultraslim scope. The [id number]                            Slim SN [id number] was introduced through the anus and                            advanced to the the terminal ileum. The colonoscopy                            was technically difficult and complex due to                            restricted mobility of the colon and a tortuous                            sigmoid colon with acute angulation. Successful                            completion of the procedure was aided by                            withdrawing the scope and replacing with the                            UltraSlim scope. The patient tolerated the                            procedure well. The quality of the bowel                            preparation was good. The terminal ileum, ileocecal  valve, appendiceal orifice, and rectum were                            photographed. Scope In: 4:23:16 PM Scope Out: 4:41:49 PM Scope Withdrawal Time: 0 hours 15 minutes 34 seconds  Total Procedure Duration: 0 hours 18 minutes 33 seconds  Findings:                 Hemorrhoids were found on perianal exam.                           The sigmoid colon was significantly tortuous with                            acute angulation and restricted mobility which                            ultimately did not allow passage of the standard                            colonoscope. Advancing the scope required                            withdrawing the scope and replacing with the                            ultraslim colonoscope.                           Multiple large-mouthed and small-mouthed                            diverticula were found in the sigmoid colon.                           Three sessile polyps were found in the sigmoid                            colon. The polyps were 2 to 4  mm in size. These                            polyps were removed with a cold snare. Resection                            and retrieval were complete. Estimated blood loss                            was minimal.                           Retroflexion in the right colon was performed.                           Non-bleeding internal hemorrhoids were found during  retroflexion. The hemorrhoids were medium-sized and                            Grade II (internal hemorrhoids that prolapse but                            reduce spontaneously).                           The terminal ileum appeared normal. Complications:            No immediate complications. Estimated Blood Loss:     Estimated blood loss was minimal. Impression:               - Hemorrhoids found on perianal exam.                           - Tortuous sigmoid colon with reduced mobility and                            acute angulation which was traversable with the                            ultraslim scope.                           - Diverticulosis in the sigmoid colon.                           - Three 2 to 4 mm polyps in the sigmoid colon,                            removed with a cold snare. Resected and retrieved.                           - Non-bleeding internal hemorrhoids.                           - The examined portion of the ileum was normal. Recommendation:           - Patient has a contact number available for                            emergencies. The signs and symptoms of potential                            delayed complications were discussed with the                            patient. Return to normal activities tomorrow.                            Written discharge instructions were provided to the                            patient.                           -  Resume previous diet.                           - Continue present medications.                           - Await pathology  results.                           - Repeat colonoscopy for surveillance based on                            pathology results.                           - If continued symptomatic hemorrhoids, recommend                            referral to Colorectal Surgery for evaluation and                            intervention based on endoscopic appearance and                            size of hemorrhoids on this study. Harry Lindau, MD 09/24/2023 4:51:25 PM

## 2023-09-24 NOTE — Progress Notes (Signed)
 GASTROENTEROLOGY PROCEDURE H&P NOTE   Primary Care Physician: Elida Grounds, DO    Reason for Procedure:  Crohn's disease, positive fit test, history of colon polyps, hematochezia, abdominal cramping  Plan:    Colonoscopy  Patient is appropriate for endoscopic procedure(s) in the ambulatory ([facility]) setting.  The nature of the procedure, as well as the risks, benefits, and alternatives were carefully and thoroughly reviewed with the patient. Ample time for discussion and questions allowed. The patient understood, was satisfied, and agreed to proceed.     HPI: Ross Schwartz is a 58 y.o. male who presents for colonoscopy for evaluation of hematochezia, positive FIT kit test, abdominal cramping, along with history of colon polyps, and reported history of Crohn's ileitis diagnosed in 2000.  Has not been on any IBD medications in many years.  Past Medical History:  Diagnosis Date   Anxiety    Arthritis    Colon polyps    Crohn's disease (HCC)    Depression    Irritable bowel syndrome (IBS)    Lactose intolerance     Past Surgical History:  Procedure Laterality Date   APPENDECTOMY     COLONOSCOPY      Prior to Admission medications   Medication Sig Start Date End Date Taking? Authorizing Provider  cyclobenzaprine  (FLEXERIL ) 5 MG tablet Take 5 mg by mouth at bedtime as needed. 08/09/23  Yes [provider]  diclofenac  sodium (VOLTAREN ) 1 % GEL Apply 4 g topically 4 (four) times daily. 11/02/17  Yes Fleming, Zelda W, NP  fluticasone  (FLONASE ) 50 MCG/ACT nasal spray Place 2 sprays into both nostrils in the morning and at bedtime. 08/10/23 09/24/23 Yes Patel, Kunjan B, MD  ibuprofen (ADVIL) 200 MG tablet Take 400 mg by mouth 2 (two) times daily.   Yes [provider]  methocarbamol  (ROBAXIN ) 750 MG tablet methocarbamol  750 mg tablet  Take 1 tablet 3 times a day by oral route as needed.   Yes [provider]  OVER THE COUNTER MEDICATION Stool  softener daily   Yes [provider]    Current Outpatient Medications  Medication Sig Dispense Refill   cyclobenzaprine  (FLEXERIL ) 5 MG tablet Take 5 mg by mouth at bedtime as needed.     diclofenac  sodium (VOLTAREN ) 1 % GEL Apply 4 g topically 4 (four) times daily. 100 g 1   fluticasone  (FLONASE ) 50 MCG/ACT nasal spray Place 2 sprays into both nostrils in the morning and at bedtime. 16 g 6   ibuprofen (ADVIL) 200 MG tablet Take 400 mg by mouth 2 (two) times daily.     methocarbamol  (ROBAXIN ) 750 MG tablet methocarbamol  750 mg tablet  Take 1 tablet 3 times a day by oral route as needed.     OVER THE COUNTER MEDICATION Stool softener daily     Current Facility-Administered Medications  Medication Dose Route Frequency Provider Last Rate Last Admin   0.9 %  sodium chloride infusion  500 mL Intravenous Once Jaidy Cottam V, DO        Allergies as of 09/24/2023 - Review Complete 09/24/2023  Allergen Reaction Noted   Amoxicillin Other (See Comments) 11/25/2011   Penicillins Swelling 11/25/2011   Azithromycin Hives 11/25/2011   Trazodone  and nefazodone Other (See Comments) 04/23/2016    Family History  Problem Relation Age of Onset   Liver disease Mother    Kidney disease Mother    Heart disease Father    Alzheimer's disease Maternal Grandmother    Heart disease Paternal Grandmother  Colon cancer Neg Hx    Esophageal cancer Neg Hx    Rectal cancer Neg Hx    Stomach cancer Neg Hx     Social History   Socioeconomic History   Marital status: Legally Separated    Spouse name: Not on file   Number of children: 2   Years of education: Not on file   Highest education level: Not on file  Occupational History   Occupation: [employer] representative  Tobacco Use   Smoking status: Every Day    Types: Cigarettes   Smokeless tobacco: Never  Vaping Use   Vaping status: Never Used  Substance and Sexual Activity   Alcohol use: Yes    Alcohol/week: 0.0 standard drinks of alcohol    Drug use: Yes    Types: Marijuana   Sexual activity: Not Currently  Other Topics Concern   Not on file  Social History Narrative   Not on file   Social Drivers of Health   Financial Resource Strain: Not on file  Food Insecurity: Not on file  Transportation Needs: Not on file  Physical Activity: Not on file  Stress: Not on file  Social Connections: Not on file  Intimate Partner Violence: Not on file    Physical Exam: Vital signs in last 24 hours: @BP  113/64   Pulse 88   Temp 98.2 F (36.8 C)   Ht 5\' 8"  (1.727 m)   Wt 143 lb (64.9 kg)   SpO2 98%   BMI 21.74 kg/m  GEN: NAD EYE: Sclerae anicteric ENT: MMM CV: Non-tachycardic Pulm: CTA b/l GI: Soft, NT/ND NEURO:  Alert & Oriented x 3   Ross Lindau, DO [facility]   09/24/2023 4:04 PM

## 2023-09-24 NOTE — Progress Notes (Signed)
Rosey Eide CRNA relieves Tourist information centre manager after report

## 2023-09-24 NOTE — Progress Notes (Signed)
 Called to room to assist during endoscopic procedure.  Patient ID and intended procedure confirmed with present staff. Received instructions for my participation in the procedure from the performing physician.

## 2023-09-27 ENCOUNTER — Telehealth: Payer: Self-pay

## 2023-09-27 NOTE — Telephone Encounter (Signed)
 Left message

## 2023-09-29 LAB — SURGICAL PATHOLOGY

## 2023-09-30 ENCOUNTER — Telehealth: Payer: Self-pay | Admitting: Otolaryngology

## 2023-10-01 NOTE — Telephone Encounter (Signed)
 CALLED PT CONFIRMED ADDRESS MJM

## 2023-10-02 ENCOUNTER — Encounter: Payer: Self-pay | Admitting: Gastroenterology

## 2023-10-04 ENCOUNTER — Encounter (INDEPENDENT_AMBULATORY_CARE_PROVIDER_SITE_OTHER): Payer: Self-pay | Admitting: Otolaryngology

## 2023-10-04 ENCOUNTER — Ambulatory Visit (INDEPENDENT_AMBULATORY_CARE_PROVIDER_SITE_OTHER): Admitting: Audiology

## 2023-10-04 ENCOUNTER — Ambulatory Visit (INDEPENDENT_AMBULATORY_CARE_PROVIDER_SITE_OTHER): Admitting: Otolaryngology

## 2023-10-04 VITALS — BP 134/76 | HR 72 | Ht 68.0 in | Wt 140.0 lb

## 2023-10-04 DIAGNOSIS — H90A32 Mixed conductive and sensorineural hearing loss, unilateral, left ear with restricted hearing on the contralateral side: Secondary | ICD-10-CM

## 2023-10-04 DIAGNOSIS — H90A21 Sensorineural hearing loss, unilateral, right ear, with restricted hearing on the contralateral side: Secondary | ICD-10-CM

## 2023-10-04 DIAGNOSIS — F17200 Nicotine dependence, unspecified, uncomplicated: Secondary | ICD-10-CM

## 2023-10-04 DIAGNOSIS — H938X3 Other specified disorders of ear, bilateral: Secondary | ICD-10-CM | POA: Diagnosis not present

## 2023-10-04 DIAGNOSIS — H6993 Unspecified Eustachian tube disorder, bilateral: Secondary | ICD-10-CM | POA: Diagnosis not present

## 2023-10-04 DIAGNOSIS — F17210 Nicotine dependence, cigarettes, uncomplicated: Secondary | ICD-10-CM | POA: Diagnosis not present

## 2023-10-04 DIAGNOSIS — H903 Sensorineural hearing loss, bilateral: Secondary | ICD-10-CM | POA: Diagnosis not present

## 2023-10-04 NOTE — Progress Notes (Signed)
  [address] [place], [place] [address] [phone]  Audiological Evaluation    Name: Ross Schwartz     DOB:   03-05-1966      MRN:   [id number]                                                                                     Service Date: 10/04/2023     Accompanied by: unaccompanied   Patient comes today after Dr. Lydia Sams, ENT sent a referral for a hearing evaluation due to concerns with Eustachian tube dysfunction.   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 Left ear:  Clear external ear canals and notable landmarks visualized on the tympanic membrane.  Tympanometry: Right ear: Type A- Normal external ear canal volume with normal middle ear pressure and tympanic membrane compliance. Initially the test would show an abnormally positive middle ear pressure.   Left ear: Type A- Normal external ear canal volume with normal middle ear pressure and tympanic membrane compliance.    Pure tone Audiometry: Right ear- Normal to moderate sensorineural hearing loss from 125 Hz - 8000 Hz. Left ear-  Borderline normal to moderate sensorineural hearing loss from 125 Hz - 8000 Hz.  Speech Audiometry: Right ear- Speech Reception Threshold (SRT) was obtained at 30 dBHL. Left ear-Speech Reception Threshold (SRT) was obtained at 35 dBHL.   Word Recognition Score Tested using NU-6 (MLV) Right ear: 100% was obtained at a presentation level of 70 dBHL with contralateral masking which is deemed as  excellent. Left ear: 92% was obtained at a presentation level of  70 dBHL with contralateral masking which is deemed as  excellent.   The hearing test results were completed under headphones and re-checked with inserts and results are deemed to be of good to fair reliability. Test technique:  conventional      Recommendations: Follow up with ENT as scheduled for today.  Repeat audiogram after medical care or as per MD. Recommend using hearing protection , as allowed by his job.  Reyanne Hussar MARIE LEROUX-MARTINEZ, AUD

## 2023-10-04 NOTE — Progress Notes (Signed)
 Dear Dr. Claudius Cumins, Here is my assessment for our mutual patient, Ross Schwartz. Thank you for allowing me the opportunity to care for your patient. Please do not hesitate to contact me should you have any other questions. Sincerely, Dr. Milon Aloe  Otolaryngology Clinic Note Referring provider: Dr. Claudius Cumins HPI:  Ross Schwartz is a 58 y.o. male kindly referred by Dr. Claudius Cumins for evaluation of ear fullness and hearing loss.  Initial visit (07/2023): Patient reports: ongoing bilateral ear fullness for several years, and he reports that he had a URI about 9 months ago, and it never seemed to have gone away. He had some ear pain at that point, but that has resolved. Hearing is muffled, feels "as if I'm driving through the mountains." When he pops his ears, he feels like he is back to normal. He has some nasal congestion, and some anterior rhinorrhea but no facial pain/pressure, discolored drainage, hyposmia, or other CRS sx. Nose overall is doing well. No nasal spray use.  Patient denies: NO ear pain, vertigo, drainage, tinnitus Patient additionally denies: deep pain in ear canal, eustachian tube symptoms such as popping/crackling, sensitive to pressure changes Patient also denies barotrauma, vestibular suppressant use, ototoxic medication use Prior ear surgery: no Does not get frequent ear infections.  No hearing test recently.   --------------------------------------------------------- 10/04/2023 Seen in follow up. Reports continued ear fullness, pred finished, using flonase  but did not help. Continued muffled hearing and improvement when he pops his ears. No drainage, vertigo, tinnitus. No nasal sx. Did have audio done.   H&N Surgery: no Personal or FHx of bleeding dz or anesthesia difficulty: no  PMHx: Crohn's, IBS, GERD, Back Pain  AP/AC: no  Tobacco: smokes 1 PPD (30 pack year). Lives in [place], [place]  Independent Review of Additional Tests or Records:  CT S-spine (05/19/2021): reviewed  and interpreted independently with respect to ears - mastoids and ME well aerated, no noted otic capsule or ossicular chain abnormality Dr. Claudius Cumins (PCP) notes reviewed and uploaded or available in chart in media tab (06/15/2023): noted bilateral otalgia, would like to see specialist; Dx: otalgia, Rx: ref ENT CMP reviewed 06/15/2023: Bun/Cr 10/1.01 Updated labs: 08/16/2023: CBC w/Diff: WBC 10.9, Eos 0; CRP/ESR: wnl 09/2023 Audiogram was independently reviewed and interpreted by me and it reveals - AD: normal dowsloping to mild/mod SNHL then upsloping to boderling at 8000 Hz; AS: similar pattern to AD but small conductive component with ~10dB ABG at lower freq; Ad/Ad tymps; WRT 100% AD and 92% AS at 70 and 75dB HL Tymps: Ad AS, Ad(?)/Positive pressure AD  SNHL= Sensorineural hearing loss   PMH/Meds/All/SocHx/FamHx/ROS:   Past Medical History:  Diagnosis Date   Anxiety    Arthritis    Colon polyps    Crohn's disease (HCC)    Depression    Irritable bowel syndrome (IBS)    Lactose intolerance      Past Surgical History:  Procedure Laterality Date   APPENDECTOMY     COLONOSCOPY      Family History  Problem Relation Age of Onset   Liver disease Mother    Kidney disease Mother    Heart disease Father    Alzheimer's disease Maternal Grandmother    Heart disease Paternal Grandmother    Colon cancer Neg Hx    Esophageal cancer Neg Hx    Rectal cancer Neg Hx    Stomach cancer Neg Hx      Social Connections: Not on file      Current Outpatient Medications:  cyclobenzaprine  (FLEXERIL ) 5 MG tablet, Take 5 mg by mouth at bedtime as needed., Disp: , Rfl:    diclofenac  sodium (VOLTAREN ) 1 % GEL, Apply 4 g topically 4 (four) times daily., Disp: 100 g, Rfl: 1   gabapentin (NEURONTIN) 300 MG capsule, Take 300 mg by mouth daily., Disp: , Rfl:    ibuprofen (ADVIL) 200 MG tablet, Take 400 mg by mouth 2 (two) times daily., Disp: , Rfl:    OVER THE COUNTER MEDICATION, Stool softener daily,  Disp: , Rfl:    fluticasone  (FLONASE ) 50 MCG/ACT nasal spray, Place 2 sprays into both nostrils in the morning and at bedtime., Disp: 16 g, Rfl: 6   methocarbamol  (ROBAXIN ) 750 MG tablet, methocarbamol  750 mg tablet  Take 1 tablet 3 times a day by oral route as needed. (Patient not taking: Reported on 10/04/2023), Disp: , Rfl:    Physical Exam:   BP 134/76 (BP Location: Right Arm, Patient Position: Sitting, Cuff Size: Normal)   Pulse 72   Ht 5\' 8"  (1.727 m)   Wt 140 lb (63.5 kg)   SpO2 96%   BMI 21.29 kg/m   Salient findings:  CN II-XII intact Given history and complaints, ear microscopy was indicated and performed for evaluation with findings as below in physical exam section and in procedures; Bilateral EAC clear and TM intact with aerated middle ear space but with anterior mild retraction, on pneumatic insufflation and him popping his ear, posterior portion of TM moves, not anterior - this is stable Weber 512: mid Rinne 512: AC > BC b/l  Anterior rhinoscopy: Septum relatively midline; bilateral inferior turbinates without significant hypertrophy No lesions of oral cavity/oropharynx; edentulous No obviously palpable neck masses/lymphadenopathy/thyromegaly No respiratory distress or stridor  Seprately Identifiable Procedures:  Procedure: Bilateral ear microscopy using microscope (CPT 92504) Pre-procedure diagnosis: bilateral hearing loss, eustachian tube dysfunction (bilateral), bilateral ear fullness Post-procedure diagnosis: same Indication:; given patient's otologic complaints and history, for improved and comprehensive examination of external ear and tympanic membrane, bilateral otologic examination using microscope was performed  Procedure: Patient was placed semi-recumbent. Both ear canals were examined using the microscope with findings above. Patient tolerated the procedure well.   Impression & Plans:  Ross Schwartz is a 58 y.o. male with:  1. Dysfunction of both  eustachian tubes   2. Sensation of fullness in both ears   3. Sensorineural hearing loss (SNHL) of right ear with restricted hearing of left ear   4. Tobacco use disorder    Noted bilateral ear fullness and muffled hearing since he had a URI 9 months ago. No effusion or ear pain. Sx and exam seem consistent with ETD. He has tried medical management without improvement, and we again discussed options including ET dilation and BTT given how bothersome this is for him. Small conductive component artifact? - will observe  We had a long discussion about benefits and risks of BTT including pain, bleeding, infection, early or late extrusion, TM perforation, otorrhea, injury to middle or external ear structures, nature of tympanostomy tubes, hearing loss, among others.   He opted for BTT. Continue flonase  and autoinsufflation of ears in interim  Will schedule; f/u 2 weeks for BTT Consider amplification for HL  See below regarding exact medications prescribed this encounter including dosages and route: No orders of the defined types were placed in this encounter.     Thank you for allowing me the opportunity to care for your patient. Please do not hesitate to contact me should you have  any other questions.  Sincerely, Milon Aloe, MD Otolaryngologist (ENT), [facility] Phone: [phone] Fax: [phone]  10/17/2023, 11:49 AM   MDM:  Level 4 - 99214 Complexity/Problems addressed: mod - chronic problems Data complexity: mod - review of multiple labs, test - Morbidity: low currently  - Prescription Drug prescribed or managed: yes

## 2023-10-11 ENCOUNTER — Encounter: Payer: Self-pay | Admitting: Audiology

## 2023-10-23 DIAGNOSIS — M545 Low back pain, unspecified: Secondary | ICD-10-CM | POA: Diagnosis not present

## 2023-10-27 ENCOUNTER — Encounter (INDEPENDENT_AMBULATORY_CARE_PROVIDER_SITE_OTHER): Payer: Self-pay | Admitting: Otolaryngology

## 2023-10-27 ENCOUNTER — Ambulatory Visit (INDEPENDENT_AMBULATORY_CARE_PROVIDER_SITE_OTHER): Admitting: Otolaryngology

## 2023-10-27 VITALS — BP 113/71 | HR 79 | Ht 68.0 in | Wt 140.0 lb

## 2023-10-27 DIAGNOSIS — H9012 Conductive hearing loss, unilateral, left ear, with unrestricted hearing on the contralateral side: Secondary | ICD-10-CM | POA: Diagnosis not present

## 2023-10-27 DIAGNOSIS — H6993 Unspecified Eustachian tube disorder, bilateral: Secondary | ICD-10-CM | POA: Diagnosis not present

## 2023-10-27 DIAGNOSIS — H938X3 Other specified disorders of ear, bilateral: Secondary | ICD-10-CM

## 2023-10-27 MED ORDER — OFLOXACIN 0.3 % OT SOLN: 4.0000 [drp] | Freq: Two times a day (BID) | OTIC | 1 refills | Status: AC

## 2023-10-27 NOTE — Patient Instructions (Signed)
 Use ofloxacin drops - 4 drops twice daily for 7 days.

## 2023-10-27 NOTE — Progress Notes (Unsigned)
 Otolaryngology Procedure Visit:   Procedure: Bilateral ear microscopy with myringotomy and tympanostomy tube placement (CPT 8166786757) - 50 Pre-procedure diagnosis:  Left conductive hearing loss Bilateral eustachian tube dysfunction Post-procedure diagnosis: same Indication: Patient is a 58 y.o. male with pre-procedure diagnoses above with persistent b/l ear fullness and muffled hearing after URI several months ago. We discussed options: 1. Observation 2. Nasal sprays 3. Tympanostomy tube. Risks discussed, patient opted for tympanostomy tube placement.  We had a long discussion about benefits and risks of Tympanostomy tube placement including pain, bleeding, infection, early or late extrusion, TM perforation, otorrhea, injury to middle or external ear structures, nature of tympanostomy tubes, cholesteatoma, hearing loss, persistent symptoms, among others. Consent was obtained prior to proceeding.  Findings: Right Ear: small serous effusion, landmarks on TM visible Left ear: small serous effusion; landmarks on TM visible Successful tympanostomy tube placement bilaterally  Complications: None apparent  Procedure details: Patient was placed semi recumbent on the exam chair. The right ear was addressed first with binocular microscopy and any cerumen was cleaned from the ear canal. The tympanic membrane was visualized, with findings as above. A focal spot over the anterior-inferior TM was anesthetized with topical phenol. A myringotomy incision wastthen made radially. Serous effusion was encountered. The middle ear cleft was suctioned. The PE tube was placed and ciprodex drops were applied. A cotton ball was placed at the meatus. The same procedure with findings above was repeated on the contralateral side with small serous effusion evacuated and tube placed inferiorly.  Patient tolerated the procedure well  - Follow up 3 months with audiogram; Ciprodex drops AD x7d

## 2023-10-29 DIAGNOSIS — M545 Low back pain, unspecified: Secondary | ICD-10-CM | POA: Diagnosis not present

## 2023-10-29 DIAGNOSIS — M791 Myalgia, unspecified site: Secondary | ICD-10-CM | POA: Diagnosis not present

## 2023-10-29 DIAGNOSIS — M47896 Other spondylosis, lumbar region: Secondary | ICD-10-CM | POA: Diagnosis not present

## 2023-11-11 ENCOUNTER — Encounter: Payer: Self-pay | Admitting: Gastroenterology

## 2023-11-11 NOTE — Telephone Encounter (Signed)
 Refer to duplicate pt message.

## 2023-11-17 DIAGNOSIS — M5416 Radiculopathy, lumbar region: Secondary | ICD-10-CM | POA: Diagnosis not present

## 2023-12-03 DIAGNOSIS — M5416 Radiculopathy, lumbar region: Secondary | ICD-10-CM | POA: Diagnosis not present

## 2023-12-03 DIAGNOSIS — M47816 Spondylosis without myelopathy or radiculopathy, lumbar region: Secondary | ICD-10-CM | POA: Diagnosis not present

## 2023-12-03 DIAGNOSIS — M791 Myalgia, unspecified site: Secondary | ICD-10-CM | POA: Diagnosis not present

## 2023-12-07 DIAGNOSIS — Z8719 Personal history of other diseases of the digestive system: Secondary | ICD-10-CM | POA: Diagnosis not present

## 2023-12-07 DIAGNOSIS — K642 Third degree hemorrhoids: Secondary | ICD-10-CM | POA: Diagnosis not present

## 2023-12-07 DIAGNOSIS — K56699 Other intestinal obstruction unspecified as to partial versus complete obstruction: Secondary | ICD-10-CM | POA: Diagnosis not present

## 2023-12-07 DIAGNOSIS — K635 Polyp of colon: Secondary | ICD-10-CM | POA: Diagnosis not present

## 2023-12-08 ENCOUNTER — Ambulatory Visit: Payer: Self-pay | Admitting: Surgery

## 2023-12-28 DIAGNOSIS — M5416 Radiculopathy, lumbar region: Secondary | ICD-10-CM | POA: Diagnosis not present

## 2024-01-11 DIAGNOSIS — M5416 Radiculopathy, lumbar region: Secondary | ICD-10-CM | POA: Diagnosis not present

## 2024-01-11 DIAGNOSIS — M791 Myalgia, unspecified site: Secondary | ICD-10-CM | POA: Diagnosis not present

## 2024-01-11 DIAGNOSIS — M47816 Spondylosis without myelopathy or radiculopathy, lumbar region: Secondary | ICD-10-CM | POA: Diagnosis not present

## 2024-01-26 ENCOUNTER — Ambulatory Visit (INDEPENDENT_AMBULATORY_CARE_PROVIDER_SITE_OTHER): Admitting: Audiology

## 2024-01-26 ENCOUNTER — Ambulatory Visit (INDEPENDENT_AMBULATORY_CARE_PROVIDER_SITE_OTHER): Admitting: Otolaryngology

## 2024-01-26 ENCOUNTER — Encounter (INDEPENDENT_AMBULATORY_CARE_PROVIDER_SITE_OTHER): Payer: Self-pay | Admitting: Otolaryngology

## 2024-01-26 VITALS — BP 116/74 | HR 88 | Ht 68.0 in | Wt 135.0 lb

## 2024-01-26 DIAGNOSIS — H6993 Unspecified Eustachian tube disorder, bilateral: Secondary | ICD-10-CM | POA: Diagnosis not present

## 2024-01-26 DIAGNOSIS — H938X3 Other specified disorders of ear, bilateral: Secondary | ICD-10-CM | POA: Diagnosis not present

## 2024-01-26 DIAGNOSIS — F17210 Nicotine dependence, cigarettes, uncomplicated: Secondary | ICD-10-CM | POA: Diagnosis not present

## 2024-01-26 DIAGNOSIS — H903 Sensorineural hearing loss, bilateral: Secondary | ICD-10-CM | POA: Diagnosis not present

## 2024-01-26 DIAGNOSIS — H90A32 Mixed conductive and sensorineural hearing loss, unilateral, left ear with restricted hearing on the contralateral side: Secondary | ICD-10-CM

## 2024-01-26 DIAGNOSIS — F17200 Nicotine dependence, unspecified, uncomplicated: Secondary | ICD-10-CM

## 2024-01-26 NOTE — Progress Notes (Signed)
  [address] [place], [place] [address] [phone]  Audiological Evaluation    Name: Ross Schwartz     DOB:   1966-04-19      MRN:   [id number]                                                                                     Service Date: 01/26/2024     Accompanied by: unaccompanied   Patient comes today after Dr. Tobie, ENT sent a referral for a hearing evaluation due to concerns with post operatory hearing status after pressure equalization tubes were placed.   Symptoms Yes Details  Hearing loss  [x]  10-07-23: Right ear- Normal to moderate sensorineural hearing loss from 125 Hz - 8000 Hz. Left ear-  Borderline normal to moderate sensorineural hearing loss from 125 Hz - 8000 Hz.  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 Left ear:  Clear external ear canal and pressure equalization tube was visualized.  Tympanometry: Right ear: Type B- Large external ear canal volume with no middle ear pressure peak or tympanic membrane compliance. Left ear: Type B- Large external ear canal volume with no middle ear pressure peak or tympanic membrane compliance.   Pure tone Audiometry: Right ear- Normal to moderate essentially sensorineural hearing loss (except for a 15dBHL air-bone gap at 250Hz ) from 125 Hz - 8000 Hz. Left ear-  Mild to moderately severe essentially sensorineural hearing loss (except for an air-bone gap at 4000 Hz) from 125 Hz - 8000 Hz.  Speech Audiometry: Right ear- Speech Reception Threshold (SRT) was obtained at 30 dBHL. Left ear-Speech Reception Threshold (SRT) was obtained at 40 dBHL.   Word  Recognition Score Tested using NU-6 (recorded) Right ear: 100% was obtained at a presentation level of 70 dBHL with contralateral masking which is deemed as  excellent. Left ear: 84% was obtained at a presentation level of 80 dBHL with contralateral masking which is deemed as  excellent.   The hearing test results were completed under headphones and re-checked with inserts and results are deemed to be of good reliability. Test technique:  conventional      Recommendations: Follow up with ENT as scheduled for today. Repeat audiogram in conjunction with medical care.   Ross Schwartz, AUD

## 2024-01-26 NOTE — Progress Notes (Signed)
 Dear Dr. Auston, Here is my assessment for our mutual patient, Ross Schwartz. Thank you for allowing me the opportunity to care for your patient. Please do not hesitate to contact me should you have any other questions. Sincerely, Dr. Eldora Blanch  Otolaryngology Clinic Note Referring provider: Dr. Auston HPI:  Ross Schwartz is a 58 y.o. male kindly referred by Dr. Auston for evaluation of ear fullness and hearing loss.  Initial visit (07/2023): Patient reports: ongoing bilateral ear fullness for several years, and he reports that he had a URI about 9 months ago, and it never seemed to have gone away. He had some ear pain at that point, but that has resolved. Hearing is muffled, feels as if I'm driving through the mountains. When he pops his ears, he feels like he is back to normal. He has some nasal congestion, and some anterior rhinorrhea but no facial pain/pressure, discolored drainage, hyposmia, or other CRS sx. Nose overall is doing well. No nasal spray use.  Patient denies: NO ear pain, vertigo, drainage, tinnitus Patient additionally denies: deep pain in ear canal, eustachian tube symptoms such as popping/crackling, sensitive to pressure changes Patient also denies barotrauma, vestibular suppressant use, ototoxic medication use Prior ear surgery: no Does not get frequent ear infections.  No hearing test recently.   --------------------------------------------------------- 10/04/2023 Seen in follow up. Reports continued ear fullness, pred finished, using flonase  but did not help. Continued muffled hearing and improvement when he pops his ears. No drainage, vertigo, tinnitus. No nasal sx. Did have audio done. --------------------------------------------------------- 01/26/2024 Seen in follow up. Still having some ear fullness after tubes, but no ETD symptoms. Hearing is muffled. Did have audio done. No ear drainage.    H&N Surgery: no Personal or FHx of bleeding dz or anesthesia  difficulty: no  PMHx: Crohn's, IBS, GERD, Back Pain  AP/AC: no  Tobacco: smokes 1 PPD (30 pack year). Lives in [place], [place]  Independent Review of Additional Tests or Records:  CT S-spine (05/19/2021): reviewed and interpreted independently with respect to ears - mastoids and ME well aerated, no noted otic capsule or ossicular chain abnormality Dr. Auston (PCP) notes reviewed and uploaded or available in chart in media tab (06/15/2023): noted bilateral otalgia, would like to see specialist; Dx: otalgia, Rx: ref ENT CMP reviewed 06/15/2023: Bun/Cr 10/1.01 Updated labs: 08/16/2023: CBC w/Diff: WBC 10.9, Eos 0; CRP/ESR: wnl 09/2023 Audiogram was independently reviewed and interpreted by me and it reveals - AD: normal dowsloping to mild/mod SNHL then upsloping to boderling at 8000 Hz; AS: similar pattern to AD but small conductive component with ~10dB ABG at lower freq; Ad/Ad tymps; WRT 100% AD and 92% AS at 70 and 75dB HL Tymps: Ad AS, Ad(?)/Positive pressure AD  SNHL= Sensorineural hearing loss  12/2023 Audiogram was independently reviewed and interpreted by me and it reveals - lg vol/lg vol tymps; essentially SNHL AD normal to mod (slight decrease lower freq); AS - mild to mod sev essentially SNHL (except 15dB ABG 500 Hz); WRT 100%/84% at 70/80dB AD/AS - overall, slight decline low freq hearing thrseholds  SNHL= Sensorineural hearing loss   PMH/Meds/All/SocHx/FamHx/ROS:   Past Medical History:  Diagnosis Date   Anxiety    Arthritis    Colon polyps    Crohn's disease (HCC)    Depression    Irritable bowel syndrome (IBS)    Lactose intolerance      Past Surgical History:  Procedure Laterality Date   APPENDECTOMY     COLONOSCOPY  Family History  Problem Relation Age of Onset   Liver disease Mother    Kidney disease Mother    Heart disease Father    Alzheimer's disease Maternal Grandmother    Heart disease Paternal Grandmother    Colon cancer Neg Hx    Esophageal cancer Neg Hx     Rectal cancer Neg Hx    Stomach cancer Neg Hx      Social Connections: Not on file      Current Outpatient Medications:    cyclobenzaprine  (FLEXERIL ) 5 MG tablet, Take 5 mg by mouth at bedtime as needed., Disp: , Rfl:    diclofenac  sodium (VOLTAREN ) 1 % GEL, Apply 4 g topically 4 (four) times daily., Disp: 100 g, Rfl: 1   fluticasone  (FLONASE ) 50 MCG/ACT nasal spray, Place 2 sprays into both nostrils in the morning and at bedtime., Disp: 16 g, Rfl: 6   gabapentin (NEURONTIN) 300 MG capsule, Take 300 mg by mouth daily., Disp: , Rfl:    ibuprofen (ADVIL) 200 MG tablet, Take 400 mg by mouth 2 (two) times daily., Disp: , Rfl:    methocarbamol  (ROBAXIN ) 750 MG tablet, , Disp: , Rfl:    OVER THE COUNTER MEDICATION, Stool softener daily, Disp: , Rfl:    Physical Exam:   BP 116/74 (BP Location: Right Arm, Patient Position: Sitting, Cuff Size: Normal)   Pulse 88   Ht 5' 8 (1.727 m)   Wt 135 lb (61.2 kg)   SpO2 97%   BMI 20.53 kg/m   Salient findings:  CN II-XII intact Bilateral EAC clear and TM intact with aerated middle ear space; PE tubes in place, patent and dry b/l Weber 512: mid Rinne 512: AC > BC b/l  Anterior rhinoscopy: Septum relatively midline; bilateral inferior turbinates without significant hypertrophy No respiratory distress or stridor  Seprately Identifiable Procedures:  None today   Impression & Plans:  Ross Schwartz is a 57 y.o. male with:  1. Dysfunction of both eustachian tubes   2. Sensation of fullness in both ears   3. Mixed conductive and sensorineural hearing loss of left ear with restricted hearing of right ear   4. Tobacco use disorder    Noted bilateral ear fullness and muffled hearing since he had a URI 9 months ago. Sx and exam seem consistent with ETD which did not help. Underwent BTT, with some improvement in ETD sx but having muffled hearing; we discussed options and he would like BTT removed   Will schedule for removal; we had discussed  amplification prior and would recommend this regardless for this hearing as muffled hearing/hearing loss likely partly as a result of this -- resource sheet provided  See below regarding exact medications prescribed this encounter including dosages and route: No orders of the defined types were placed in this encounter.     Thank you for allowing me the opportunity to care for your patient. Please do not hesitate to contact me should you have any other questions.  Sincerely, Eldora Blanch, MD Otolaryngologist (ENT), [facility] Phone: [phone] Fax: [phone]  01/26/2024, 1:36 PM   I have personally spent 30 minutes involved in face-to-face and non-face-to-face activities for this patient on the day of the visit.  Professional time spent excludes any procedures performed but includes the following activities, in addition to those noted in the documentation: preparing to see the patient (review of outside documentation and results), performing a medically appropriate examination, counseling, documenting in the electronic health record

## 2024-01-28 ENCOUNTER — Encounter: Payer: Self-pay | Admitting: Audiology

## 2024-02-04 DIAGNOSIS — M5416 Radiculopathy, lumbar region: Secondary | ICD-10-CM | POA: Diagnosis not present

## 2024-02-14 ENCOUNTER — Other Ambulatory Visit: Payer: Self-pay | Admitting: Physician Assistant

## 2024-02-14 DIAGNOSIS — M545 Low back pain, unspecified: Secondary | ICD-10-CM

## 2024-02-15 ENCOUNTER — Encounter (INDEPENDENT_AMBULATORY_CARE_PROVIDER_SITE_OTHER): Payer: Self-pay | Admitting: Otolaryngology

## 2024-02-15 ENCOUNTER — Ambulatory Visit (INDEPENDENT_AMBULATORY_CARE_PROVIDER_SITE_OTHER): Admitting: Otolaryngology

## 2024-02-15 VITALS — BP 105/66 | HR 88 | Ht 68.0 in

## 2024-02-15 DIAGNOSIS — H6993 Unspecified Eustachian tube disorder, bilateral: Secondary | ICD-10-CM

## 2024-02-15 DIAGNOSIS — H90A32 Mixed conductive and sensorineural hearing loss, unilateral, left ear with restricted hearing on the contralateral side: Secondary | ICD-10-CM | POA: Diagnosis not present

## 2024-02-15 DIAGNOSIS — H938X3 Other specified disorders of ear, bilateral: Secondary | ICD-10-CM | POA: Diagnosis not present

## 2024-02-15 DIAGNOSIS — Z9622 Myringotomy tube(s) status: Secondary | ICD-10-CM

## 2024-02-15 NOTE — Progress Notes (Signed)
 OTOLARYNGOLOGY PROCEDURE VISIT  DATE OF PROCEDURE: 02/15/24   PRE-OPERATIVE DIAGNOSIS:  Bilateral ear fullness with concern for eustachian tube dysfunction S/p tympanostomy tube placement Mixed conductive and sensorineural hearing loss of left ear with restricted hearing of right ear   POST-OPERATIVE DIAGNOSIS: Same  PROCEDURE: Bilateral tympanostomy tube removal and tympanic membrane repair with paper patch myringoplasty - CPT 30389-49  Surgeon(s) and Role: Eldora Blanch, MD  INDICATIONS:  Ross Schwartz is a 58 y.o. male with diagnoses above with persistent symptoms and he reports worsening hearing after BTT. We discussed R/B/A and he opted for tympanostomy tube removal and paperpatch myringoplasty; I did discuss R/B/A including future infections, lack of closure, persistent symptoms and hearing loss and eustachian tube dysfunction among others and patient opted to proceed after consent  FINDINGS:  Bilateral tympanostomy tubes in place, removed per patient preference; paper patch placed  PROCEDURE DETAILS:  After being properly identified, the patient placed supine. Consent was obtained.  The right ear was inspected under the microscope. The prior tympanostomy tube was removed with a straight pick. With the TM fully in view, the edges of the TM perforation were roughed up and freshened with a rasp.  With the edges bleeding, a paper patch was placed over the perforation. The same procedure was repeated on the left. A cotton ball was then placed in the ear canal.  The patient tolerated the procedure well  COMPLICATIONS:  None apparent  FOLLOW UP: 2 months  Cloteal Isaacson B Arrington Yohe

## 2024-02-27 ENCOUNTER — Encounter: Payer: Self-pay | Admitting: Gastroenterology

## 2024-03-08 ENCOUNTER — Telehealth: Payer: Self-pay

## 2024-06-15 ENCOUNTER — Ambulatory Visit (INDEPENDENT_AMBULATORY_CARE_PROVIDER_SITE_OTHER): Admitting: Otolaryngology

## 2024-06-29 ENCOUNTER — Ambulatory Visit (INDEPENDENT_AMBULATORY_CARE_PROVIDER_SITE_OTHER): Payer: Self-pay | Admitting: Otolaryngology

## 2024-06-29 ENCOUNTER — Encounter (INDEPENDENT_AMBULATORY_CARE_PROVIDER_SITE_OTHER): Payer: Self-pay | Admitting: Otolaryngology

## 2024-06-29 VITALS — BP 123/82 | HR 97 | Ht 68.0 in | Wt 135.0 lb

## 2024-06-29 DIAGNOSIS — H6993 Unspecified Eustachian tube disorder, bilateral: Secondary | ICD-10-CM

## 2024-06-29 DIAGNOSIS — H938X3 Other specified disorders of ear, bilateral: Secondary | ICD-10-CM

## 2024-06-29 DIAGNOSIS — F17200 Nicotine dependence, unspecified, uncomplicated: Secondary | ICD-10-CM

## 2024-06-29 DIAGNOSIS — H90A32 Mixed conductive and sensorineural hearing loss, unilateral, left ear with restricted hearing on the contralateral side: Secondary | ICD-10-CM

## 2024-06-29 MED ORDER — AZELASTINE HCL 0.1 % NA SOLN: 2.0000 | Freq: Two times a day (BID) | NASAL | 12 refills | Status: AC

## 2024-06-29 NOTE — Progress Notes (Signed)
 Dear Dr. Auston, Here is my assessment for our mutual patient, Ross Schwartz. Thank you for allowing me the opportunity to care for your patient. Please do not hesitate to contact me should you have any other questions. Sincerely, Dr. Eldora Blanch  Otolaryngology Clinic Note Referring provider: Dr. Auston HPI:  Ross Schwartz is a 59 y.o. male kindly referred by Dr. Auston for evaluation of ear fullness and hearing loss.  Initial visit (07/2023): Patient reports: ongoing bilateral ear fullness for several years, and he reports that he had a URI about 9 months ago, and it never seemed to have gone away. He had some ear pain at that point, but that has resolved. Hearing is muffled, feels as if I'm driving through the mountains. When he pops his ears, he feels like he is back to normal. He has some nasal congestion, and some anterior rhinorrhea but no facial pain/pressure, discolored drainage, hyposmia, or other CRS sx. Nose overall is doing well. No nasal spray use.  Patient denies: NO ear pain, vertigo, drainage, tinnitus Patient additionally denies: deep pain in ear canal, eustachian tube symptoms such as popping/crackling, sensitive to pressure changes Patient also denies barotrauma, vestibular suppressant use, ototoxic medication use Prior ear surgery: no Does not get frequent ear infections.  No hearing test recently.   --------------------------------------------------------- 10/04/2023 Seen in follow up. Reports continued ear fullness, pred finished, using flonase  but did not help. Continued muffled hearing and improvement when he pops his ears. No drainage, vertigo, tinnitus. No nasal sx. Did have audio done. --------------------------------------------------------- 01/26/2024 Seen in follow up. Still having some ear fullness after tubes, but no ETD symptoms. Hearing is muffled. Did have audio done. No ear  drainage. --------------------------------------------------------- 06/29/2024 Seen in follow up. He reports that after the tube removal, he has been doing much better. The hearing has improved and is back to baseline. Still having some ear fullness but this resolves when he pops his ears. He feels somewhat congested currently.  H&N Surgery: no Personal or FHx of bleeding dz or anesthesia difficulty: no  PMHx: Crohn's, IBS, GERD, Back Pain  AP/AC: no  Tobacco: smokes 1 PPD (30 pack year). Lives in [place], [place]  Independent Review of Additional Tests or Records:  CT S-spine (05/19/2021): reviewed and interpreted independently with respect to ears - mastoids and ME well aerated, no noted otic capsule or ossicular chain abnormality Dr. Auston (PCP) notes reviewed and uploaded or available in chart in media tab (06/15/2023): noted bilateral otalgia, would like to see specialist; Dx: otalgia, Rx: ref ENT CMP reviewed 06/15/2023: Bun/Cr 10/1.01 Updated labs: 08/16/2023: CBC w/Diff: WBC 10.9, Eos 0; CRP/ESR: wnl 09/2023 Audiogram was independently reviewed and interpreted by me and it reveals - AD: normal dowsloping to mild/mod SNHL then upsloping to boderling at 8000 Hz; AS: similar pattern to AD but small conductive component with ~10dB ABG at lower freq; Ad/Ad tymps; WRT 100% AD and 92% AS at 70 and 75dB HL Tymps: Ad AS, Ad(?)/Positive pressure AD  SNHL= Sensorineural hearing loss  12/2023 Audiogram was independently reviewed and interpreted by me and it reveals - lg vol/lg vol tymps; essentially SNHL AD normal to mod (slight decrease lower freq); AS - mild to mod sev essentially SNHL (except 15dB ABG 500 Hz); WRT 100%/84% at 70/80dB AD/AS - overall, slight decline low freq hearing thrseholds  SNHL= Sensorineural hearing loss   PMH/Meds/All/SocHx/FamHx/ROS:   Past Medical History:  Diagnosis Date   Anxiety    Arthritis    Colon polyps  Crohn's disease (HCC)    Depression    Irritable bowel  syndrome (IBS)    Lactose intolerance      Past Surgical History:  Procedure Laterality Date   APPENDECTOMY     COLONOSCOPY      Family History  Problem Relation Age of Onset   Liver disease Mother    Kidney disease Mother    Heart disease Father    Alzheimer's disease Maternal Grandmother    Heart disease Paternal Grandmother    Colon cancer Neg Hx    Esophageal cancer Neg Hx    Rectal cancer Neg Hx    Stomach cancer Neg Hx      Social Connections: Not on file      Current Outpatient Medications:    azelastine  (ASTELIN ) 0.1 % nasal spray, Place 2 sprays into both nostrils 2 (two) times daily. Use in each nostril as directed, Disp: 30 mL, Rfl: 12   cyclobenzaprine  (FLEXERIL ) 5 MG tablet, Take 5 mg by mouth at bedtime as needed., Disp: , Rfl:    diclofenac  sodium (VOLTAREN ) 1 % GEL, Apply 4 g topically 4 (four) times daily., Disp: 100 g, Rfl: 1   fluticasone  (FLONASE ) 50 MCG/ACT nasal spray, Place 2 sprays into both nostrils in the morning and at bedtime., Disp: 16 g, Rfl: 6   gabapentin (NEURONTIN) 300 MG capsule, Take 300 mg by mouth daily., Disp: , Rfl:    ibuprofen (ADVIL) 200 MG tablet, Take 400 mg by mouth 2 (two) times daily., Disp: , Rfl:    LYRICA 50 MG capsule, Take 2 capsules twice a day by oral route for 30 days., Disp: , Rfl:    methocarbamol  (ROBAXIN ) 750 MG tablet, , Disp: , Rfl:    OVER THE COUNTER MEDICATION, Stool softener daily, Disp: , Rfl:    Physical Exam:   BP 123/82 (BP Location: Left Arm, Patient Position: Sitting, Cuff Size: Large)   Pulse 97   Ht 5' 8 (1.727 m)   Wt 135 lb (61.2 kg)   SpO2 97%   BMI 20.53 kg/m   Salient findings:  CN II-XII intact Given history and complaints, ear microscopy was indicated and performed for evaluation with findings as below in physical exam section and in procedures; Bilateral EAC clear and TM intact with aerated middle ear space; small monomeric area left TM Weber 512: mid Rinne 512: AC > BC b/l  Anterior  rhinoscopy: Septum relatively midline; bilateral inferior turbinates without significant hypertrophy No respiratory distress or stridor  Seprately Identifiable Procedures:  Procedure: Bilateral ear microscopy using microscope (CPT 92504) Pre-procedure diagnosis: bilateral ear fullness Post-procedure diagnosis: same Indication: see above; given patient's otologic complaints and history, for improved and comprehensive examination of external ear and tympanic membrane, bilateral otologic examination using microscope was performed. Prior to proceeding, verbal consent was obtained after discussion of R/B/A  Procedure: Patient was placed semi-recumbent. Both ear canals were examined using the microscope with findings above. Patient tolerated the procedure well.    Impression & Plans:  Ross Schwartz is a 59 y.o. male with:  1. Sensation of fullness in both ears   2. Dysfunction of both eustachian tubes   3. Mixed conductive and sensorineural hearing loss of left ear with restricted hearing of right ear   4. Tobacco use disorder    Noted bilateral ear fullness and muffled hearing since he had a URI in 2024. Sx and exam seem consistent with ETD which did not improve with flonase . Ear popping helps. Underwent BTT,  with some improvement in ETD sx but not all the way so we did remove his tymp tubes and his hearing improved after but stll with some ETD sx.  Based on his sx, seems to have a ETD component and we discussed options such as ET dilation and return to medical mgmt. He would like to try medical management again.  Start astelin  and flonase  BID; continue nasal saline spray Recommend popping ears multiple times per day D/w pt f/u, and he opted PRN f/u; return precautions discussed We had discussed amplification prior and would recommend this regardless for this hearing as muffled hearing/hearing loss likely partly as a result of this -- resource sheet provided prior  See below regarding exact  medications prescribed this encounter including dosages and route: Meds ordered this encounter  Medications   azelastine  (ASTELIN ) 0.1 % nasal spray    Sig: Place 2 sprays into both nostrils 2 (two) times daily. Use in each nostril as directed    Dispense:  30 mL    Refill:  12      Thank you for allowing me the opportunity to care for your patient. Please do not hesitate to contact me should you have any other questions.  Sincerely, Eldora Blanch, MD Otolaryngologist (ENT), [facility] Phone: [phone] Fax: [phone]  06/29/2024, 10:21 AM   MDM:  [id number] Complexity/Problems addressed: mod - chronic multiple problems Data complexity: low - Morbidity: mod   - Drug prescribed or managed: y

## 2024-06-29 NOTE — Patient Instructions (Signed)
 Use astelin  spray two sprays each nostril twice daily as needed

## 2024-10-09 ENCOUNTER — Encounter: Admitting: Family Medicine
